# Patient Record
Sex: Male | Born: 1945 | Race: White | Hispanic: No | Smoking: Never smoker
Health system: Southern US, Community
[De-identification: ages and names within clinical notes are randomized; demographics above are authoritative.]

## PROBLEM LIST (undated history)

## (undated) DIAGNOSIS — M1990 Unspecified osteoarthritis, unspecified site: Secondary | ICD-10-CM

## (undated) DIAGNOSIS — Z8489 Family history of other specified conditions: Secondary | ICD-10-CM

## (undated) DIAGNOSIS — I10 Essential (primary) hypertension: Secondary | ICD-10-CM

## (undated) DIAGNOSIS — Z972 Presence of dental prosthetic device (complete) (partial): Secondary | ICD-10-CM

## (undated) DIAGNOSIS — I6381 Other cerebral infarction due to occlusion or stenosis of small artery: Secondary | ICD-10-CM

---

## 2019-10-07 ENCOUNTER — Other Ambulatory Visit: Payer: Self-pay

## 2019-10-08 ENCOUNTER — Encounter: Payer: Self-pay | Admitting: Gastroenterology

## 2019-10-08 ENCOUNTER — Ambulatory Visit (INDEPENDENT_AMBULATORY_CARE_PROVIDER_SITE_OTHER): Payer: Medicare Other | Admitting: Gastroenterology

## 2019-10-08 ENCOUNTER — Other Ambulatory Visit: Payer: Self-pay

## 2019-10-08 ENCOUNTER — Encounter (INDEPENDENT_AMBULATORY_CARE_PROVIDER_SITE_OTHER): Payer: Self-pay

## 2019-10-08 VITALS — BP 167/72 | HR 74 | Temp 97.9°F | Ht 68.0 in | Wt 198.2 lb

## 2019-10-08 DIAGNOSIS — R140 Abdominal distension (gaseous): Secondary | ICD-10-CM | POA: Diagnosis not present

## 2019-10-08 DIAGNOSIS — K219 Gastro-esophageal reflux disease without esophagitis: Secondary | ICD-10-CM | POA: Diagnosis not present

## 2019-10-08 DIAGNOSIS — R194 Change in bowel habit: Secondary | ICD-10-CM

## 2019-10-08 MED ORDER — NA SULFATE-K SULFATE-MG SULF 17.5-3.13-1.6 GM/177ML PO SOLN: 354.0000 mL | Freq: Once | ORAL | 0 refills | Status: AC

## 2019-10-08 NOTE — Progress Notes (Signed)
Arlyss Repress, MD [address]  [address]  [place], [place] [address]  Main: [phone]  Fax: [phone]    Gastroenterology Consultation  Referring Provider:     Marina Goodell, MD Primary Care Physician:  Marina Goodell, MD Primary Gastroenterologist:  Dr. Arlyss Repress Reason for Consultation:     Altered bowel habits, chronic gerd        HPI:   Gary Esparza is a 74 y.o. male referred by Dr. Marina Goodell, MD  for consultation & management of altered bowel habits.  Patient reports history of small frequent bowel movements, variable consistency, hard to lose, string-like, nonbloody.  Reports incomplete evacuation, abdominal bloating.  He goes about 3-4 times a day.  He reports that he does not consume fruits and vegetables daily.  He also reports mild lower abdominal discomfort.  He is taking Benefiber once a day.  He has not tried any stool softeners.  He denies any weight loss  Chronic GERD: Patient reports chronic burning in his throat and regurgitation of acid which is intermittent, triggered after eating certain foods.  Patient cannot eat late at night, reports nocturnal heartburn.  He takes acid suppressive medication as needed only.  He never had an upper endoscopy  Patient does not smoke or drink alcohol Patient retired from [employer], moved from [place] few years ago to live with his girlfriend He denies any abdominal surgeries NSAIDs: None  Antiplts/Anticoagulants/Anti thrombotics: None  GI Procedures: Colonoscopy several years ago, reportedly normal  History reviewed. No pertinent past medical history.  History reviewed. No pertinent surgical history.  Current Outpatient Medications:  .  cetirizine (ZYRTEC) 10 MG tablet, Take by mouth., Disp: , Rfl:  .  Cholecalciferol (VITAMIN D-3) 125 MCG (5000 UT) TABS, Take by mouth., Disp: , Rfl:  .  Corn Dextrin ([employer] EASY FIBER) POWD, Take by mouth., Disp: , Rfl:  .  losartan (COZAAR) 25 MG  tablet, Take by mouth., Disp: , Rfl:  .  Multiple Vitamins-Minerals (VITRUM SENIOR) TABS, Take by mouth., Disp: , Rfl:  .  pravastatin (PRAVACHOL) 20 MG tablet, Take by mouth., Disp: , Rfl:  .  Probiotic Product (PROBIOTIC-10 PO), Take by mouth., Disp: , Rfl:  .  Na Sulfate-K Sulfate-Mg Sulf 17.5-3.13-1.6 GM/177ML SOLN, Take 354 mLs by mouth once for 1 dose., Disp: 354 mL, Rfl: 0   History reviewed. No pertinent family history.   Social History   Tobacco Use  . Smoking status: Never Smoker  . Smokeless tobacco: Never Used  Vaping Use  . Vaping Use: Never used  Substance Use Topics  . Alcohol use: Yes    Comment: 2 a month   . Drug use: Never    Allergies as of 10/08/2019 - Review Complete 10/08/2019  Allergen Reaction Noted  . Other Rash 08/27/2019    Review of Systems:    All systems reviewed and negative except where noted in HPI.   Physical Exam:  BP (!) 167/72 (BP Location: Left Arm, Patient Position: Sitting, Cuff Size: Normal)   Pulse 74   Temp 97.9 F (36.6 C) (Oral)   Ht 5\' 8"  (1.727 m)   Wt 198 lb 4 oz (89.9 kg)   BMI 30.14 kg/m  No LMP for male patient.  General:   Alert,  Well-developed, well-nourished, pleasant and cooperative in NAD Head:  Normocephalic and atraumatic. Eyes:  Sclera clear, no icterus.   Conjunctiva pink. Ears:  Normal auditory acuity. Nose:  No deformity, discharge,  or lesions. Mouth:  No deformity or lesions,oropharynx pink & moist. Neck:  Supple; no masses or thyromegaly. Lungs:  Respirations even and unlabored.  Clear throughout to auscultation.   No wheezes, crackles, or rhonchi. No acute distress. Heart:  Regular rate and rhythm; no murmurs, clicks, rubs, or gallops. Abdomen:  Normal bowel sounds. Soft, non-tender and moderately distended, tympanic to percussion without masses, hepatosplenomegaly or hernias noted.  No guarding or rebound tenderness.   Rectal: Not performed Msk: Prominent and enlarged right clavicular bone,  compared to left. Good, equal movement & strength bilaterally. Pulses:  Normal pulses noted. Extremities:  No clubbing or edema.  No cyanosis. Neurologic:  Alert and oriented x3;  grossly normal neurologically. Skin:  Intact without significant lesions or rashes. No jaundice. Psych:  Alert and cooperative. Normal mood and affect.  Imaging Studies: No abdominal imaging  Assessment and Plan:   Gary Esparza is a 74 y.o. male with no significant past medical history is seen in consultation for altered bowel habits and chronic GERD  Altered bowel habits with incomplete evacuation and abdominal bloating Most likely secondary to constipation Discussed about high-fiber diet, fiber supplements Adequate intake of water Recommend colonoscopy given his age  Chronic GERD Continue antireflux lifestyle, information provided Recommend EGD to screen for Barrett's  Prominent and enlarged right clavicular bone Follow-up with PCP   Follow up in 2 to 3 months   Cephas Darby, MD

## 2019-10-08 NOTE — Patient Instructions (Addendum)
High-Fiber Diet Fiber, also called dietary fiber, is a type of carbohydrate that is found in fruits, vegetables, whole grains, and beans. A high-fiber diet can have many health benefits. Your health care provider may recommend a high-fiber diet to help:  Prevent constipation. Fiber can make your bowel movements more regular.  Lower your cholesterol.  Relieve the following conditions: ? Swelling of veins in the anus (hemorrhoids). ? Swelling and irritation (inflammation) of specific areas of the digestive tract (uncomplicated diverticulosis). ? A problem of the large intestine (colon) that sometimes causes pain and diarrhea (irritable bowel syndrome, IBS).  Prevent overeating as part of a weight-loss plan.  Prevent heart disease, type 2 diabetes, and certain cancers. What is my plan? The recommended daily fiber intake in grams (g) includes:  38 g for men age 50 or younger.  30 g for men over age 50.  25 g for women age 50 or younger.  21 g for women over age 50. You can get the recommended daily intake of dietary fiber by:  Eating a variety of fruits, vegetables, grains, and beans.  Taking a fiber supplement, if it is not possible to get enough fiber through your diet. What do I need to know about a high-fiber diet?  It is better to get fiber through food sources rather than from fiber supplements. There is not a lot of research about how effective supplements are.  Always check the fiber content on the nutrition facts label of any prepackaged food. Look for foods that contain 5 g of fiber or more per serving.  Talk with a diet and nutrition specialist (dietitian) if you have questions about specific foods that are recommended or not recommended for your medical condition, especially if those foods are not listed below.  Gradually increase how much fiber you consume. If you increase your intake of dietary fiber too quickly, you may have bloating, cramping, or gas.  Drink plenty  of water. Water helps you to digest fiber. What are tips for following this plan?  Eat a wide variety of high-fiber foods.  Make sure that half of the grains that you eat each day are whole grains.  Eat breads and cereals that are made with whole-grain flour instead of refined flour or white flour.  Eat brown rice, bulgur wheat, or millet instead of white rice.  Start the day with a breakfast that is high in fiber, such as a cereal that contains 5 g of fiber or more per serving.  Use beans in place of meat in soups, salads, and pasta dishes.  Eat high-fiber snacks, such as berries, raw vegetables, nuts, and popcorn.  Choose whole fruits and vegetables instead of processed forms like juice or sauce. What foods can I eat?  Fruits Berries. Pears. Apples. Oranges. Avocado. Prunes and raisins. Dried figs. Vegetables Sweet potatoes. Spinach. Kale. Artichokes. Cabbage. Broccoli. Cauliflower. Green peas. Carrots. Squash. Grains Whole-grain breads. Multigrain cereal. Oats and oatmeal. Brown rice. Barley. Bulgur wheat. Millet. Quinoa. Bran muffins. Popcorn. Rye wafer crackers. Meats and other proteins Navy, kidney, and pinto beans. Soybeans. Split peas. Lentils. Nuts and seeds. Dairy Fiber-fortified yogurt. Beverages Fiber-fortified soy milk. Fiber-fortified orange juice. Other foods Fiber bars. The items listed above may not be a complete list of recommended foods and beverages. Contact a dietitian for more options. What foods are not recommended? Fruits Fruit juice. Cooked, strained fruit. Vegetables Fried potatoes. Canned vegetables. Well-cooked vegetables. Grains White bread. Pasta made with refined flour. White rice. Meats and other   proteins Fatty cuts of meat. Fried chicken or fried fish. Dairy Milk. Yogurt. Cream cheese. Sour cream. Fats and oils Butters. Beverages Soft drinks. Other foods Cakes and pastries. The items listed above may not be a complete list of foods  and beverages to avoid. Contact a dietitian for more information. Summary  Fiber is a type of carbohydrate. It is found in fruits, vegetables, whole grains, and beans.  There are many health benefits of eating a high-fiber diet, such as preventing constipation, lowering blood cholesterol, helping with weight loss, and reducing your risk of heart disease, diabetes, and certain cancers.  Gradually increase your intake of fiber. Increasing too fast can result in cramping, bloating, and gas. Drink plenty of water while you increase your fiber.  The best sources of fiber include whole fruits and vegetables, whole grains, nuts, seeds, and beans. This information is not intended to replace advice given to you by your health care provider. Make sure you discuss any questions you have with your health care provider. Document Revised: 02/12/2017 Document Reviewed: 02/12/2017 Elsevier Patient Education  2020 Elsevier Inc. Food Choices for Gastroesophageal Reflux Disease, Adult When you have gastroesophageal reflux disease (GERD), the foods you eat and your eating habits are very important. Choosing the right foods can help ease your discomfort. Think about working with a nutrition specialist (dietitian) to help you make good choices. What are tips for following this plan?  Meals  Choose healthy foods that are low in fat, such as fruits, vegetables, whole grains, low-fat dairy products, and lean meat, fish, and poultry.  Eat small meals often instead of 3 large meals a day. Eat your meals slowly, and in a place where you are relaxed. Avoid bending over or lying down until 2-3 hours after eating.  Avoid eating meals 2-3 hours before bed.  Avoid drinking a lot of liquid with meals.  Cook foods using methods other than frying. Bake, grill, or broil food instead.  Avoid or limit: ? Chocolate. ? Peppermint or spearmint. ? Alcohol. ? Pepper. ? Black and decaffeinated coffee. ? Black and  decaffeinated tea. ? Bubbly (carbonated) soft drinks. ? Caffeinated energy drinks and soft drinks.  Limit high-fat foods such as: ? Fatty meat or fried foods. ? Whole milk, cream, butter, or ice cream. ? Nuts and nut butters. ? Pastries, donuts, and sweets made with butter or shortening.  Avoid foods that cause symptoms. These foods may be different for everyone. Common foods that cause symptoms include: ? Tomatoes. ? Oranges, lemons, and limes. ? Peppers. ? Spicy food. ? Onions and garlic. ? Vinegar. Lifestyle  Maintain a healthy weight. Ask your doctor what weight is healthy for you. If you need to lose weight, work with your doctor to do so safely.  Exercise for at least 30 minutes for 5 or more days each week, or as told by your doctor.  Wear loose-fitting clothes.  Do not smoke. If you need help quitting, ask your doctor.  Sleep with the head of your bed higher than your feet. Use a wedge under the mattress or blocks under the bed frame to raise the head of the bed. Summary  When you have gastroesophageal reflux disease (GERD), food and lifestyle choices are very important in easing your symptoms.  Eat small meals often instead of 3 large meals a day. Eat your meals slowly, and in a place where you are relaxed.  Limit high-fat foods such as fatty meat or fried foods.  Avoid bending over or   lying down until 2-3 hours after eating.  Avoid peppermint and spearmint, caffeine, alcohol, and chocolate. This information is not intended to replace advice given to you by your health care provider. Make sure you discuss any questions you have with your health care provider. Document Revised: 08/01/2018 Document Reviewed: 05/16/2016 Elsevier Patient Education  2020 Elsevier Inc.  

## 2019-11-24 ENCOUNTER — Other Ambulatory Visit: Payer: Self-pay | Admitting: Family Medicine

## 2019-11-24 ENCOUNTER — Other Ambulatory Visit: Payer: Self-pay | Admitting: *Deleted

## 2019-11-24 DIAGNOSIS — R310 Gross hematuria: Secondary | ICD-10-CM

## 2019-11-25 ENCOUNTER — Other Ambulatory Visit
Admission: RE | Admit: 2019-11-25 | Discharge: 2019-11-25 | Disposition: A | Discharge status: Home / Self Care | Payer: Medicare Other | Attending: Urology | Admitting: Urology

## 2019-11-25 ENCOUNTER — Encounter: Payer: Self-pay | Admitting: Urology

## 2019-11-25 ENCOUNTER — Other Ambulatory Visit: Payer: Self-pay

## 2019-11-25 ENCOUNTER — Ambulatory Visit (INDEPENDENT_AMBULATORY_CARE_PROVIDER_SITE_OTHER): Payer: Medicare Other | Admitting: Urology

## 2019-11-25 VITALS — BP 146/85 | HR 76 | Ht 67.0 in | Wt 198.0 lb

## 2019-11-25 DIAGNOSIS — R310 Gross hematuria: Secondary | ICD-10-CM | POA: Diagnosis not present

## 2019-11-25 LAB — URINALYSIS, COMPLETE (UACMP) WITH MICROSCOPIC
Bacteria, UA: NONE SEEN
Bilirubin Urine: NEGATIVE
Glucose, UA: NEGATIVE mg/dL
Ketones, ur: NEGATIVE mg/dL
Leukocytes,Ua: NEGATIVE
Nitrite: NEGATIVE
Protein, ur: NEGATIVE mg/dL
Specific Gravity, Urine: 1.01 (ref 1.005–1.030)
WBC, UA: NONE SEEN WBC/hpf (ref 0–5)
pH: 6.5 (ref 5.0–8.0)

## 2019-11-25 NOTE — Progress Notes (Signed)
   11/25/19 10:27 AM   Gary Esparza 11/05/1945 [id number]  CC: Gross hematuria  HPI: I saw Gary Esparza in urology clinic today for evaluation of gross hematuria.  He reports 2 days of gross hematuria with small clots 3 to 4 days ago that has resolved spontaneously.  He denies any trauma, difficulty urinating, or any significant pain.  Urine culture showed no growth with his PCP, and greater than 50 RBCs.  Urinalysis today shows persistent microscopic hematuria with 6-10 RBCs.  Has had some mild left upper back pain that has been intermittent.  He denies any history of kidney stones.  He has a history of a TURP in 2011.  He also reportedly had gross hematuria 2 to 3 years ago that was worked up by a urologist in [place] and reportedly showed no abnormalities.  He has mild urinary symptoms of weak stream, and occasional frequency, but is not particularly bothered by his symptoms at this time.  He denies any family history of any urologic malignancies.  He denies any smoking history or other carcinogenic exposures.  Social History:  reports that he has never smoked. He has never used smokeless tobacco. He reports current alcohol use. He reports that he does not use drugs.  Physical Exam: BP (!) 146/85   Pulse 76   Ht 5\' 7"  (1.702 m)   Wt 198 lb (89.8 kg)   BMI 31.01 kg/m    Constitutional:  Alert and oriented, No acute distress. Cardiovascular: No clubbing, cyanosis, or edema. Respiratory: Normal respiratory effort, no increased work of breathing. GI: Abdomen is soft, nontender, nondistended, no abdominal masses GU: No CVA tenderness  Laboratory Data: Reviewed, see HPI Urinalysis today with 6-10 RBCs, otherwise benign  Pertinent Imaging: None to review  Assessment & Plan:   Healthy 74 year old male with asymptomatic gross hematuria that has since resolved, but persistent microscopic hematuria today on urinalysis.  We discussed common possible etiologies of hematuria including  BPH, malignancy, urolithiasis, medical renal disease, and idiopathic. Standard workup recommended by the [facility] includes imaging with CT urogram to assess the upper tracts, and cystoscopy. Cytology is performed on patient's with gross hematuria to look for malignant cells in the urine.  -CT urogram this Friday, follow-up next week for cystoscopy -Consider outlet procedure in the future pending urinary symptoms, prostate volume and cystoscopy results, and recurrence of gross hematuria   Sunday, MD 11/25/2019  [facility] [address], [address] [place], [place] [phone]

## 2019-11-25 NOTE — Patient Instructions (Signed)

## 2019-11-27 ENCOUNTER — Other Ambulatory Visit
Admission: RE | Admit: 2019-11-27 | Discharge: 2019-11-27 | Disposition: A | Discharge status: Home / Self Care | Payer: Medicare Other | Source: Ambulatory Visit | Attending: Gastroenterology | Admitting: Gastroenterology

## 2019-11-27 ENCOUNTER — Other Ambulatory Visit: Payer: Self-pay

## 2019-11-27 ENCOUNTER — Other Ambulatory Visit: Payer: Self-pay | Admitting: Family Medicine

## 2019-11-27 ENCOUNTER — Ambulatory Visit
Admission: RE | Admit: 2019-11-27 | Discharge: 2019-11-27 | Disposition: A | Discharge status: Home / Self Care | Payer: Medicare Other | Source: Ambulatory Visit | Attending: Family Medicine | Admitting: Family Medicine

## 2019-11-27 DIAGNOSIS — R310 Gross hematuria: Secondary | ICD-10-CM | POA: Insufficient documentation

## 2019-11-27 DIAGNOSIS — Z01812 Encounter for preprocedural laboratory examination: Secondary | ICD-10-CM | POA: Insufficient documentation

## 2019-11-27 DIAGNOSIS — Z20822 Contact with and (suspected) exposure to covid-19: Secondary | ICD-10-CM | POA: Insufficient documentation

## 2019-11-28 ENCOUNTER — Other Ambulatory Visit
Admission: RE | Admit: 2019-11-28 | Discharge: 2019-11-28 | Disposition: A | Discharge status: Home / Self Care | Payer: Medicare Other | Source: Ambulatory Visit | Attending: Urology | Admitting: Urology

## 2019-11-28 ENCOUNTER — Ambulatory Visit: Payer: Medicare Other

## 2019-11-28 ENCOUNTER — Ambulatory Visit
Admission: RE | Admit: 2019-11-28 | Discharge: 2019-11-28 | Disposition: A | Discharge status: Home / Self Care | Payer: Medicare Other | Source: Ambulatory Visit | Attending: Urology | Admitting: Urology

## 2019-11-28 DIAGNOSIS — R310 Gross hematuria: Secondary | ICD-10-CM

## 2019-11-28 LAB — CREATININE, SERUM
Creatinine, Ser: 0.99 mg/dL (ref 0.61–1.24)
GFR calc Af Amer: >60 mL/min (ref >60)
GFR calc non Af Amer: >60 mL/min (ref >60)

## 2019-11-28 LAB — SARS CORONAVIRUS 2 (TAT 6-24 HRS): SARS Coronavirus 2: NEGATIVE

## 2019-11-28 IMAGING — CT CT ABD-PEL WO/W CM
2 of 6 series · 13 of 32 positions shown, 18 images · IV contrast (APPLIED)
Comparison: None.

CLINICAL DATA: Episode of gross hematuria 2 weeks ago. Persistent
microscopic hematuria.

EXAM:
CT ABDOMEN AND PELVIS WITHOUT AND WITH CONTRAST
TECHNIQUE: Multidetector CT imaging of the abdomen and pelvis was performed
following the standard protocol before and following the bolus
administration of intravenous contrast.
CONTRAST:  125mL OMNIPAQUE IOHEXOL 300 MG/ML  SOLN

[Series 2: axial pre · axial · non-contrast · 0.79mm/px · z∈[-960,-615]mm · 6 of 97 slices shown]
[im 14/97  soft-tissue]
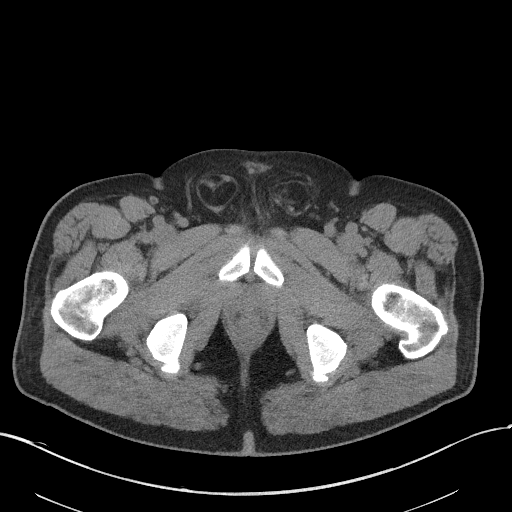
[im 28/97  soft-tissue]
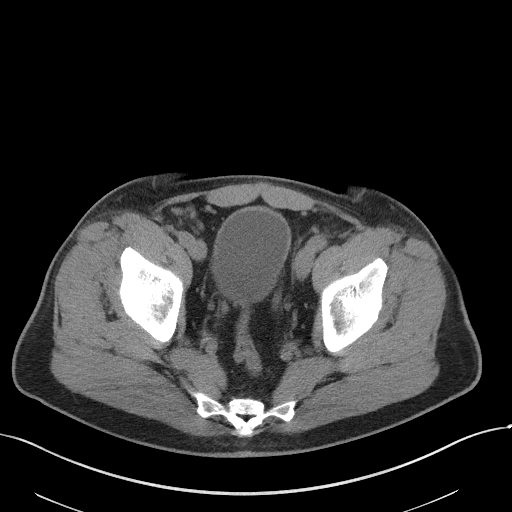
[im 42/97  soft-tissue]
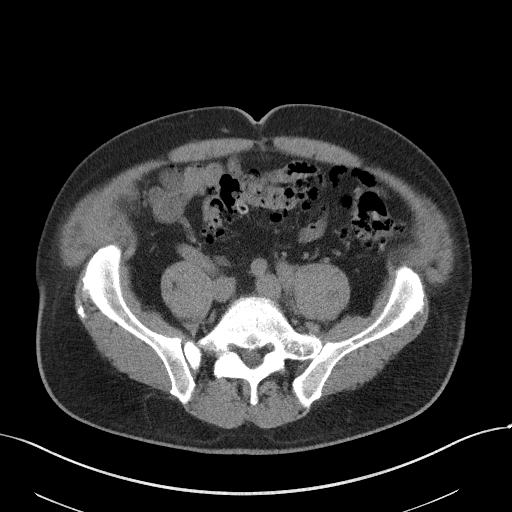
[im 55/97  soft-tissue]
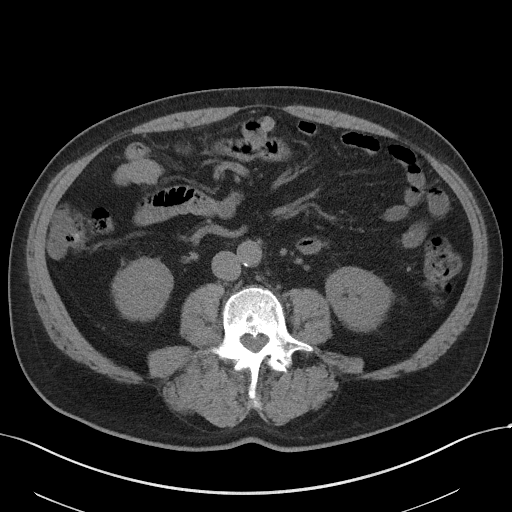
[im 69/97  soft-tissue]
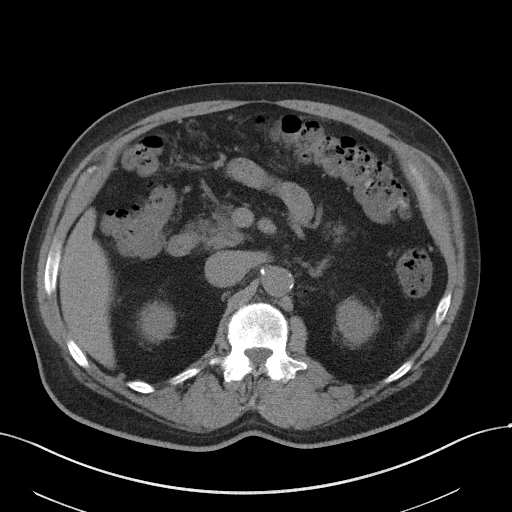
[im 83/97  soft-tissue]
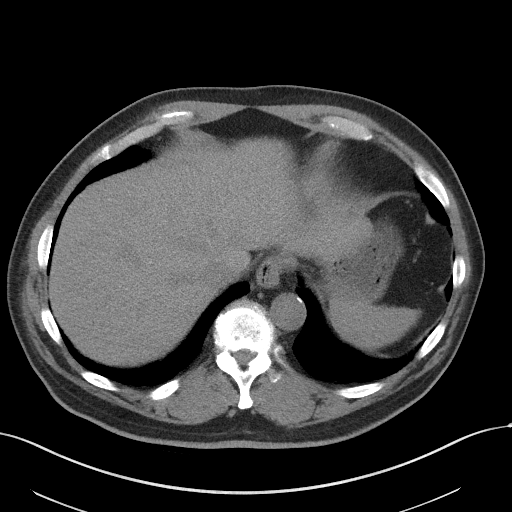

[Series 13: axial delay · axial · delayed · 0.78mm/px · z∈[-994,-604]mm · 7 of 105 slices shown, 12 images]
[im 14/105  soft-tissue]
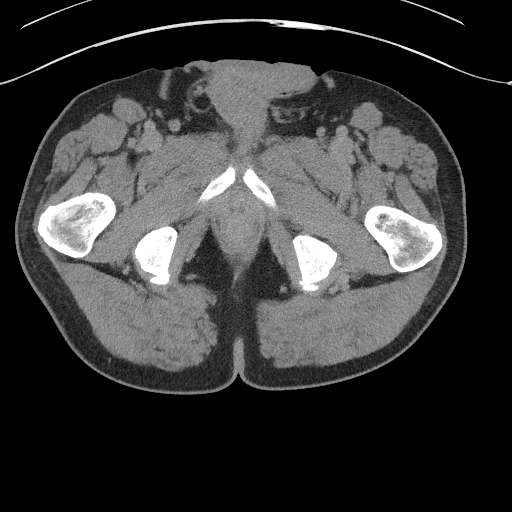
[im 14/105  bone]
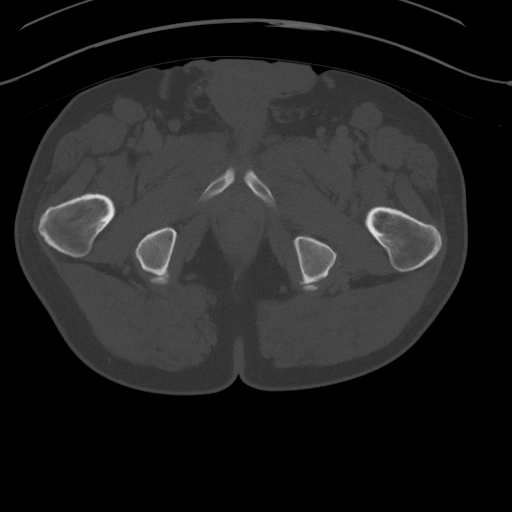
[im 27/105  soft-tissue]
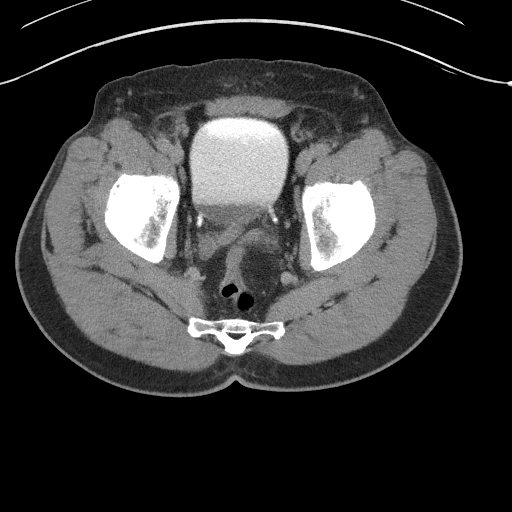
[im 40/105  soft-tissue]
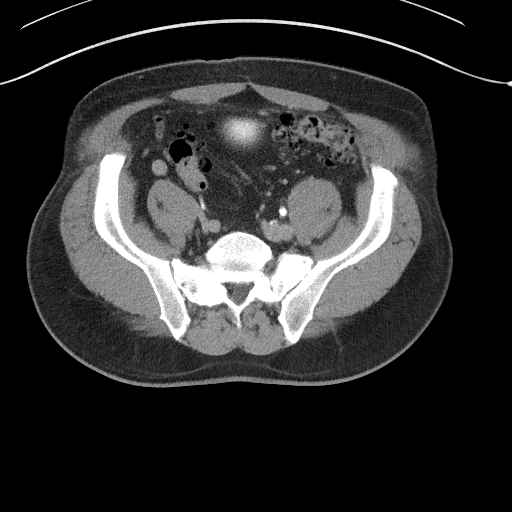
[im 53/105  soft-tissue]
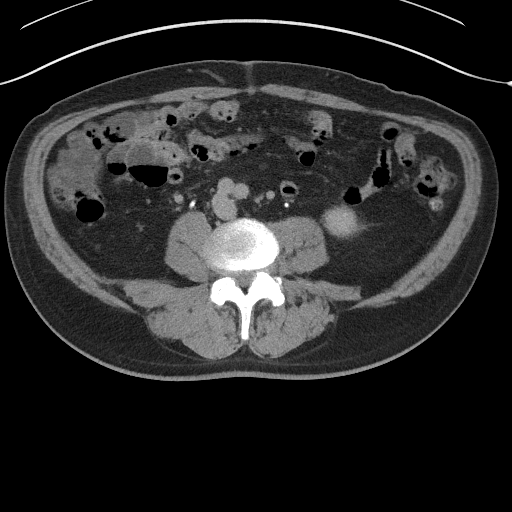
[im 53/105  lung]
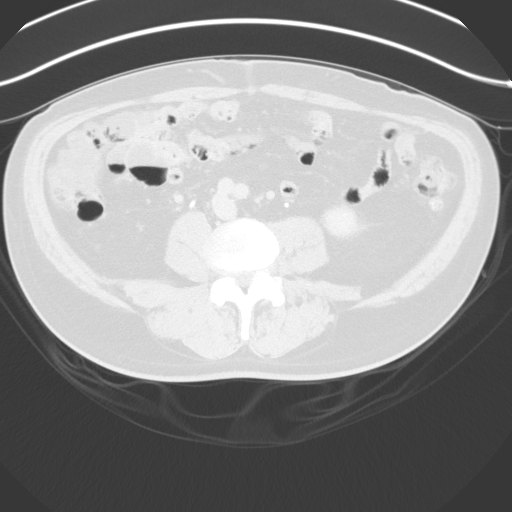
[im 66/105  soft-tissue]
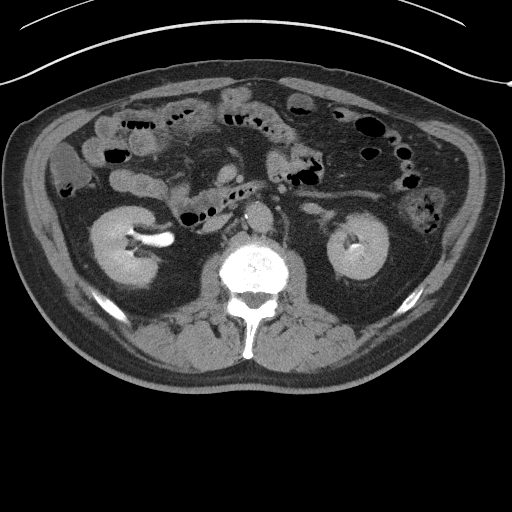
[im 66/105  lung]
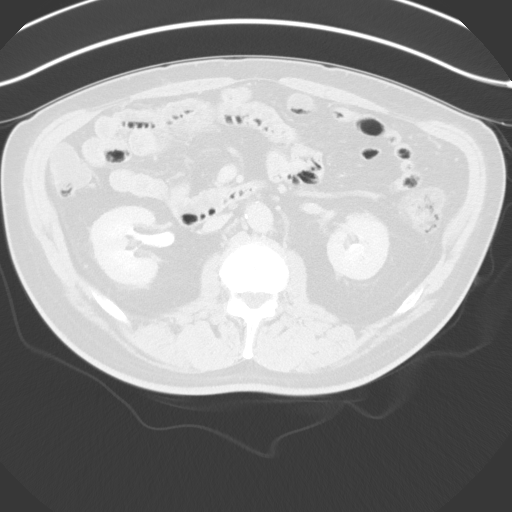
[im 79/105  soft-tissue]
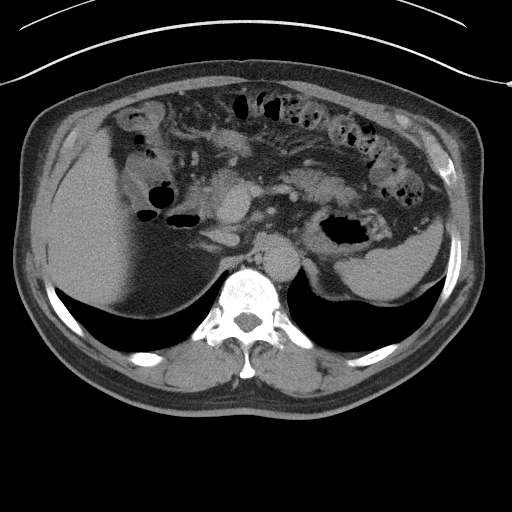
[im 79/105  lung]
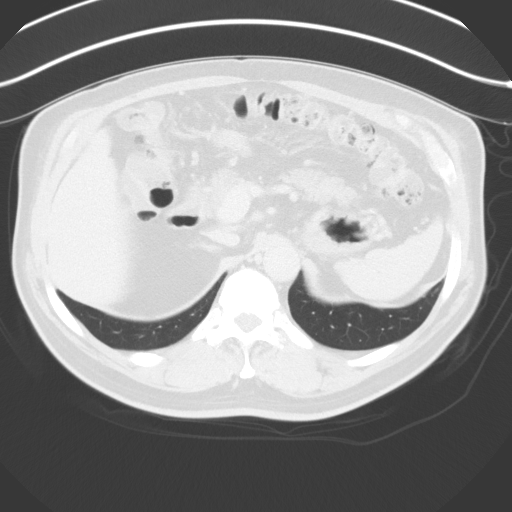
[im 92/105  soft-tissue]
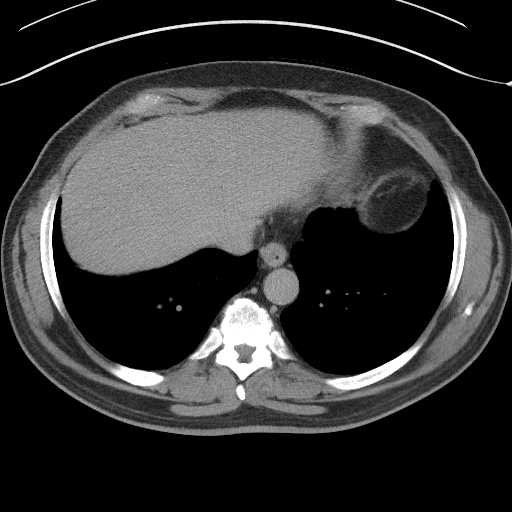
[im 92/105  lung]
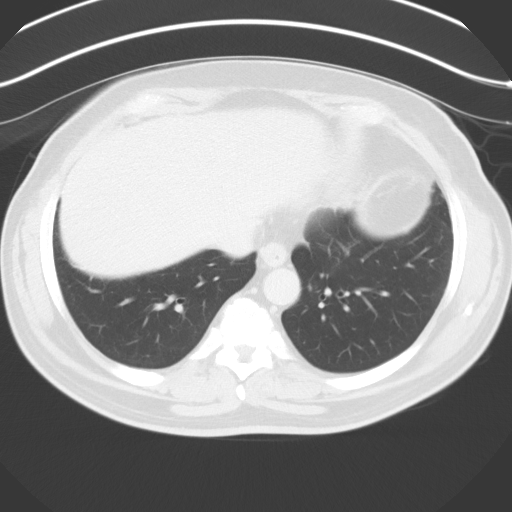

[13 of 32 positions shown; findings below may reference images not displayed]

FINDINGS: Lower chest: The lung bases are clear of acute process. No pleural
effusion or pulmonary lesions. The heart is normal in size. No
pericardial effusion. The distal esophagus and aorta are
unremarkable.

Hepatobiliary: No hepatic lesions or intrahepatic biliary
dilatation. The gallbladder is normal. No common bile duct
dilatation.

Pancreas: No mass, inflammation or ductal dilatation. Duodenal
diverticulum noted near the pancreatic head.

Spleen: Normal size.  No focal lesions.

Adrenals/Urinary Tract: The adrenal glands are normal.

No renal, ureteral or bladder calculi.

Both kidneys demonstrate normal enhancement/perfusion following
contrast administration. No worrisome renal lesions. The delayed
images do not demonstrate any significant collecting system
abnormalities. Both ureters are normal. The bladder is unremarkable.
No bladder mass or asymmetric bladder wall thickening. Enlarged
prostate gland with median lobe hypertrophy impressing on the base
the bladder is noted. There are also surgical changes from a TURP.

Stomach/Bowel: The stomach, duodenum, small bowel and colon are
grossly normal without oral contrast. No acute inflammatory changes,
mass lesions or obstructive findings. The terminal ileum and
appendix are normal. There is severe sigmoid colon diverticulosis
but no findings for acute diverticulitis.

Vascular/Lymphatic: Scattered aortic calcifications but no aneurysm
or dissection. The branch vessels are patent. The major venous
structures are patent. No mesenteric or retroperitoneal mass or
adenopathy.

Reproductive: Enlarged prostate gland with evidence of prior TURP
the seminal vesicles are unremarkable.

Other: Bilateral inguinal hernias containing fat. No pelvic mass or
adenopathy.

Musculoskeletal: No significant bony findings. Moderate degenerative
changes involving the spine.
IMPRESSION: 1. No CT findings to account for the patient's hematuria. No renal,
ureteral or bladder calculi or mass.
2. Enlarged prostate gland with median lobe hypertrophy impressing
on the base the bladder. There are also surgical changes from a
TURP.
3. Severe sigmoid colon diverticulosis without findings for acute
diverticulitis.

Aortic Atherosclerosis ([XP]-[XP]).

## 2019-11-28 MED ORDER — IOHEXOL 300 MG/ML  SOLN
125.0000 mL | Freq: Once | INTRAMUSCULAR | Status: AC | PRN
  Administered 2019-11-28: 125 mL via INTRAVENOUS

## 2019-12-01 ENCOUNTER — Encounter: Payer: Self-pay | Admitting: Gastroenterology

## 2019-12-01 ENCOUNTER — Ambulatory Visit: Payer: Medicare Other | Admitting: Registered Nurse

## 2019-12-01 ENCOUNTER — Ambulatory Visit
Admission: RE | Admit: 2019-12-01 | Discharge: 2019-12-01 | Disposition: A | Discharge status: Home / Self Care | Payer: Medicare Other | Attending: Gastroenterology | Admitting: Gastroenterology

## 2019-12-01 ENCOUNTER — Encounter
Admission: RE | Disposition: A | Discharge status: Home / Self Care | Payer: Self-pay | Source: Home / Self Care | Attending: Gastroenterology

## 2019-12-01 ENCOUNTER — Other Ambulatory Visit: Payer: Self-pay

## 2019-12-01 DIAGNOSIS — R194 Change in bowel habit: Secondary | ICD-10-CM | POA: Diagnosis present

## 2019-12-01 DIAGNOSIS — Z13810 Encounter for screening for upper gastrointestinal disorder: Secondary | ICD-10-CM | POA: Insufficient documentation

## 2019-12-01 DIAGNOSIS — K219 Gastro-esophageal reflux disease without esophagitis: Secondary | ICD-10-CM | POA: Diagnosis not present

## 2019-12-01 DIAGNOSIS — R195 Other fecal abnormalities: Secondary | ICD-10-CM | POA: Diagnosis not present

## 2019-12-01 DIAGNOSIS — K5730 Diverticulosis of large intestine without perforation or abscess without bleeding: Secondary | ICD-10-CM | POA: Diagnosis not present

## 2019-12-01 HISTORY — PX: ESOPHAGOGASTRODUODENOSCOPY (EGD) WITH PROPOFOL: SHX5813

## 2019-12-01 HISTORY — PX: COLONOSCOPY WITH PROPOFOL: SHX5780

## 2019-12-01 SURGERY — COLONOSCOPY WITH PROPOFOL
Anesthesia: General

## 2019-12-01 MED ORDER — PROPOFOL 10 MG/ML IV BOLUS
INTRAVENOUS | Status: DC | PRN
  Administered 2019-12-01: 80 mg via INTRAVENOUS

## 2019-12-01 MED ORDER — SODIUM CHLORIDE 0.9 % IV SOLN
INTRAVENOUS | Status: DC
  Administered 2019-12-01: 20 mL/h via INTRAVENOUS

## 2019-12-01 MED ORDER — LIDOCAINE HCL (CARDIAC) PF 100 MG/5ML IV SOSY
PREFILLED_SYRINGE | INTRAVENOUS | Status: DC | PRN
  Administered 2019-12-01: 100 mg via INTRAVENOUS

## 2019-12-01 MED ORDER — PROPOFOL 500 MG/50ML IV EMUL
INTRAVENOUS | Status: DC | PRN
  Administered 2019-12-01: 150 ug/kg/min via INTRAVENOUS

## 2019-12-01 NOTE — H&P (Signed)
Arlyss Repress, MD [address]  [address]  [place], [place] [address]  Main: [phone]  Fax: [phone] Pager: [phone]  Primary Care Physician:  Marina Goodell, MD Primary Gastroenterologist:  Dr. Arlyss Repress  Pre-Procedure History & Physical: HPI:  Gary Esparza is a 74 y.o. male is here for an endoscopy and colonoscopy.   History reviewed. No pertinent past medical history.  History reviewed. No pertinent surgical history.  Prior to Admission medications   Medication Sig Start Date End Date Taking? Authorizing Provider  Cholecalciferol (VITAMIN D-3) 125 MCG (5000 UT) TABS Take by mouth.    [provider]  Corn Dextrin ([employer] EASY FIBER) POWD Take by mouth.    [provider]  losartan (COZAAR) 25 MG tablet Take by mouth.    [provider]  Multiple Vitamins-Minerals (VITRUM SENIOR) TABS Take by mouth.    [provider]  pravastatin (PRAVACHOL) 20 MG tablet Take by mouth.    [provider]  Probiotic Product (PROBIOTIC-10 PO) Take by mouth.    [provider]    Allergies as of 10/08/2019 - Review Complete 10/08/2019  Allergen Reaction Noted  . Other Rash 08/27/2019    History reviewed. No pertinent family history.  Social History   Socioeconomic History  . Marital status: Widowed    Spouse name: Not on file  . Number of children: Not on file  . Years of education: Not on file  . Highest education level: Not on file  Occupational History  . Not on file  Tobacco Use  . Smoking status: Never Smoker  . Smokeless tobacco: Never Used  Vaping Use  . Vaping Use: Never used  Substance and Sexual Activity  . Alcohol use: Yes    Comment: 2 a month   . Drug use: Never  . Sexual activity: Not on file  Other Topics Concern  . Not on file  Social History Narrative  . Not on file   Social Determinants of Health   Financial Resource Strain:   . Difficulty of Paying Living Expenses:     Food Insecurity:   . Worried About Programme researcher, broadcasting/film/video in the Last Year:   . Barista in the Last Year:   Transportation Needs:   . Freight forwarder (Medical):   Marland Kitchen Lack of Transportation (Non-Medical):   Physical Activity:   . Days of Exercise per Week:   . Minutes of Exercise per Session:   Stress:   . Feeling of Stress :   Social Connections:   . Frequency of Communication with Friends and Family:   . Frequency of Social Gatherings with Friends and Family:   . Attends Religious Services:   . Active Member of Clubs or Organizations:   . Attends Banker Meetings:   Marland Kitchen Marital Status:   Intimate Partner Violence:   . Fear of Current or Ex-Partner:   . Emotionally Abused:   Marland Kitchen Physically Abused:   . Sexually Abused:     Review of Systems: See HPI, otherwise negative ROS  Physical Exam: BP (!) 147/94   Pulse 72   Temp (!) 96.2 F (35.7 C) (Temporal)   Resp 18   Ht 5\' 7"  (1.702 m)   Wt 90.7 kg   SpO2 100%   BMI 31.32 kg/m  General:   Alert,  pleasant and cooperative in NAD Head:  Normocephalic and atraumatic. Neck:  Supple; no masses or thyromegaly. Lungs:  Clear throughout to  auscultation.    Heart:  Regular rate and rhythm. Abdomen:  Soft, nontender and nondistended. Normal bowel sounds, without guarding, and without rebound.   Neurologic:  Alert and  oriented x4;  grossly normal neurologically.  Impression/Plan: Gary Esparza is here for an endoscopy and colonoscopy to be performed for altered bowel habits, chronic gerd  Risks, benefits, limitations, and alternatives regarding  endoscopy and colonoscopy have been reviewed with the patient.  Questions have been answered.  All parties agreeable.   Lannette Donath, MD  12/01/2019, 9:53 AM

## 2019-12-01 NOTE — Op Note (Signed)
[facility] Gastroenterology Patient Name: Gary Esparza Procedure Date: 12/01/2019 10:16 AM MRN: [id number] Account #: [id number] Date of Birth: [birth year] Admit Type: Outpatient Age: 74 Room: [facility] ENDO ROOM 4 Gender: Male Note Status: Finalized Procedure:             Colonoscopy Indications:           Change in bowel habits, Change in stool caliber Providers:             Lin Landsman MD, MD Medicines:             Monitored Anesthesia Care Complications:         No immediate complications. Estimated blood loss: None. Procedure:             Pre-Anesthesia Assessment:                        - Prior to the procedure, a History and Physical was                         performed, and patient medications and allergies were                         reviewed. The patient is competent. The risks and                         benefits of the procedure and the sedation options and                         risks were discussed with the patient. All questions                         were answered and informed consent was obtained.                         Patient identification and proposed procedure were                         verified by the physician, the nurse, the                         anesthesiologist, the anesthetist and the technician                         in the pre-procedure area in the procedure room in the                         endoscopy suite. Mental Status Examination: alert and                         oriented. Airway Examination: normal oropharyngeal                         airway and neck mobility. Respiratory Examination:                         clear to auscultation. CV Examination: normal.                         Prophylactic Antibiotics: The  patient does not require                         prophylactic antibiotics. Prior Anticoagulants: The                         patient has taken no previous anticoagulant or                         antiplatelet  agents. ASA Grade Assessment: II - A                         patient with mild systemic disease. After reviewing                         the risks and benefits, the patient was deemed in                         satisfactory condition to undergo the procedure. The                         anesthesia plan was to use monitored anesthesia care                         (MAC). Immediately prior to administration of                         medications, the patient was re-assessed for adequacy                         to receive sedatives. The heart rate, respiratory                         rate, oxygen saturations, blood pressure, adequacy of                         pulmonary ventilation, and response to care were                         monitored throughout the procedure. The physical                         status of the patient was re-assessed after the                         procedure.                        After obtaining informed consent, the colonoscope was                         passed under direct vision. Throughout the procedure,                         the patient's blood pressure, pulse, and oxygen                         saturations were monitored continuously. The  Colonoscope was introduced through the anus and                         advanced to the the cecum, identified by appendiceal                         orifice and ileocecal valve. The colonoscopy was                         performed without difficulty. The patient tolerated                         the procedure well. The quality of the bowel                         preparation was fair. Findings:      The perianal and digital rectal examinations were normal. Pertinent       negatives include normal sphincter tone and no palpable rectal lesions.      Multiple diverticula were found in the entire colon.      The exam was otherwise without abnormality.      The retroflexed view of the distal rectum and  anal verge was normal and       showed no anal or rectal abnormalities. Impression:            - Preparation of the colon was fair.                        - Diverticulosis in the entire examined colon.                        - The examination was otherwise normal.                        - The distal rectum and anal verge are normal on                         retroflexion view.                        - No specimens collected. Recommendation:        - Discharge patient to home (with escort).                        - Continue present medications.                        - High fiber diet. Procedure Code(s):     --- Professional ---                        [id number], Colonoscopy, flexible; diagnostic, including                         collection of specimen(s) by brushing or washing, when                         performed (separate procedure) Diagnosis Code(s):     --- Professional ---  R19.4, Change in bowel habit                        R19.5, Other fecal abnormalities                        K57.30, Diverticulosis of large intestine without                         perforation or abscess without bleeding CPT copyright 2019 American Medical Association. All rights reserved. The codes documented in this report are preliminary and upon coder review may  be revised to meet current compliance requirements. Dr. Ulyess Mort Lin Landsman MD, MD 12/01/2019 10:54:10 AM This report has been signed electronically. Number of Addenda: 0 Note Initiated On: 12/01/2019 10:16 AM Scope Withdrawal Time: 0 hours 6 minutes 50 seconds  Total Procedure Duration: 0 hours 10 minutes 6 seconds  Estimated Blood Loss:  Estimated blood loss: none.      [facility]

## 2019-12-01 NOTE — Op Note (Signed)
[facility] Gastroenterology Patient Name: Gary Esparza Procedure Date: 12/01/2019 10:16 AM MRN: [id number] Account #: [id number] Date of Birth: 02-19-1946 Admit Type: Outpatient Age: 74 Room: [facility] ENDO ROOM 4 Gender: Male Note Status: Finalized Procedure:             Upper GI endoscopy Indications:           Screening for Barrett's esophagus, Follow-up of                         gastro-esophageal reflux disease Providers:             Lin Landsman MD, MD Medicines:             Monitored Anesthesia Care Complications:         No immediate complications. Estimated blood loss: None. Procedure:             Pre-Anesthesia Assessment:                        - Prior to the procedure, a History and Physical was                         performed, and patient medications and allergies were                         reviewed. The patient is competent. The risks and                         benefits of the procedure and the sedation options and                         risks were discussed with the patient. All questions                         were answered and informed consent was obtained.                         Patient identification and proposed procedure were                         verified by the physician, the nurse, the                         anesthesiologist, the anesthetist and the technician                         in the pre-procedure area in the procedure room in the                         endoscopy suite. Mental Status Examination: alert and                         oriented. Airway Examination: normal oropharyngeal                         airway and neck mobility. Respiratory Examination:                         clear to auscultation. CV Examination: normal.  Prophylactic Antibiotics: The patient does not require                         prophylactic antibiotics. Prior Anticoagulants: The                         patient has taken no  previous anticoagulant or                         antiplatelet agents. ASA Grade Assessment: II - A                         patient with mild systemic disease. After reviewing                         the risks and benefits, the patient was deemed in                         satisfactory condition to undergo the procedure. The                         anesthesia plan was to use monitored anesthesia care                         (MAC). Immediately prior to administration of                         medications, the patient was re-assessed for adequacy                         to receive sedatives. The heart rate, respiratory                         rate, oxygen saturations, blood pressure, adequacy of                         pulmonary ventilation, and response to care were                         monitored throughout the procedure. The physical                         status of the patient was re-assessed after the                         procedure.                        After obtaining informed consent, the endoscope was                         passed under direct vision. Throughout the procedure,                         the patient's blood pressure, pulse, and oxygen                         saturations were monitored continuously. The Endoscope  was introduced through the mouth, and advanced to the                         second part of duodenum. The upper GI endoscopy was                         accomplished without difficulty. The patient tolerated                         the procedure well. Findings:      The esophagus, gastroesophageal junction and examined esophagus were       normal.      The stomach was normal.      The examined duodenum was normal.      Esophagogastric landmarks were identified: the gastroesophageal junction       was found at 40 cm from the incisors. Impression:            - Normal esophagus and gastroesophageal junction.                         - Normal stomach.                        - Normal examined duodenum.                        - Esophagogastric landmarks identified.                        - No specimens collected. Recommendation:        - Continue present medications.                        - Proceed with colonoscopy as scheduled                        See colonoscopy report Procedure Code(s):     --- Professional ---                        [phone], Esophagogastroduodenoscopy, flexible,                         transoral; diagnostic, including collection of                         specimen(s) by brushing or washing, when performed                         (separate procedure) Diagnosis Code(s):     --- Professional ---                        T51.761, Encounter for screening for upper                         gastrointestinal disorder                        K21.9, Gastro-esophageal reflux disease without                         esophagitis CPT copyright 2019 American  Medical Association. All rights reserved. The codes documented in this report are preliminary and upon coder review may  be revised to meet current compliance requirements. Dr. Ulyess Mort Lin Landsman MD, MD 12/01/2019 10:40:21 AM This report has been signed electronically. Number of Addenda: 0 Note Initiated On: 12/01/2019 10:16 AM Estimated Blood Loss:  Estimated blood loss: none.      [facility]

## 2019-12-01 NOTE — Anesthesia Postprocedure Evaluation (Signed)
Anesthesia Post Note  Patient: Gary Esparza  Procedure(s) Performed: COLONOSCOPY WITH PROPOFOL (N/A ) ESOPHAGOGASTRODUODENOSCOPY (EGD) WITH PROPOFOL (N/A )  Patient location during evaluation: Endoscopy Anesthesia Type: General Level of consciousness: awake and alert and oriented Pain management: pain level controlled Vital Signs Assessment: post-procedure vital signs reviewed and stable Respiratory status: spontaneous breathing Cardiovascular status: blood pressure returned to baseline Anesthetic complications: no   No complications documented.   Last Vitals:  Vitals:   12/01/19 0923 12/01/19 1056  BP: (!) 147/94 (!) 106/58  Pulse: 72 78  Resp: 18 (!) 21  Temp: (!) 35.7 C (!) 35.8 C  SpO2: 100% 98%    Last Pain:  Vitals:   12/01/19 1056  TempSrc: Tympanic  PainSc: Asleep                 Novah Nessel

## 2019-12-01 NOTE — Anesthesia Preprocedure Evaluation (Signed)
Anesthesia Evaluation  Patient identified by MRN, date of birth, ID band Patient awake    Reviewed: Allergy & Precautions, NPO status , Patient's Chart, lab work & pertinent test results  Airway Mallampati: II       Dental   Pulmonary neg pulmonary ROS,    Pulmonary exam normal        Cardiovascular hypertension, Normal cardiovascular exam     Neuro/Psych negative neurological ROS  negative psych ROS   GI/Hepatic Neg liver ROS,   Endo/Other  negative endocrine ROS  Renal/GU negative Renal ROS  negative genitourinary   Musculoskeletal negative musculoskeletal ROS (+)   Abdominal Normal abdominal exam  (+)   Peds negative pediatric ROS (+)  Hematology negative hematology ROS (+)   Anesthesia Other Findings History reviewed. No pertinent past medical history.  Reproductive/Obstetrics                             Anesthesia Physical Anesthesia Plan  ASA: II  Anesthesia Plan: General   Post-op Pain Management:    Induction: Intravenous  PONV Risk Score and Plan: Propofol infusion  Airway Management Planned: Nasal Cannula  Additional Equipment:   Intra-op Plan:   Post-operative Plan:   Informed Consent: I have reviewed the patients History and Physical, chart, labs and discussed the procedure including the risks, benefits and alternatives for the proposed anesthesia with the patient or authorized representative who has indicated his/her understanding and acceptance.     Dental advisory given  Plan Discussed with: CRNA and Surgeon  Anesthesia Plan Comments:         Anesthesia Quick Evaluation

## 2019-12-01 NOTE — Transfer of Care (Signed)
Immediate Anesthesia Transfer of Care Note  Patient: Gary Esparza  Procedure(s) Performed: COLONOSCOPY WITH PROPOFOL (N/A ) ESOPHAGOGASTRODUODENOSCOPY (EGD) WITH PROPOFOL (N/A )  Patient Location: PACU and Endoscopy Unit  Anesthesia Type:General  Level of Consciousness: drowsy  Airway & Oxygen Therapy: Patient Spontanous Breathing  Post-op Assessment: Report given to RN and Post -op Vital signs reviewed and stable  Post vital signs: Reviewed and stable  Last Vitals:  Vitals Value Taken Time  BP    Temp    Pulse 79 12/01/19 1056  Resp 18 12/01/19 1056  SpO2 98 % 12/01/19 1056  Vitals shown include unvalidated device data.  Last Pain:  Vitals:   12/01/19 0923  TempSrc: Temporal  PainSc: 0-No pain         Complications: No complications documented.

## 2019-12-02 ENCOUNTER — Other Ambulatory Visit: Payer: Self-pay | Admitting: *Deleted

## 2019-12-02 ENCOUNTER — Ambulatory Visit (INDEPENDENT_AMBULATORY_CARE_PROVIDER_SITE_OTHER): Payer: Medicare Other | Admitting: Urology

## 2019-12-02 ENCOUNTER — Other Ambulatory Visit: Payer: Self-pay

## 2019-12-02 ENCOUNTER — Encounter: Payer: Self-pay | Admitting: Gastroenterology

## 2019-12-02 ENCOUNTER — Other Ambulatory Visit
Admission: RE | Admit: 2019-12-02 | Discharge: 2019-12-02 | Disposition: A | Discharge status: Home / Self Care | Payer: Medicare Other | Attending: Urology | Admitting: Urology

## 2019-12-02 VITALS — BP 136/81 | HR 76 | Ht 67.5 in | Wt 196.0 lb

## 2019-12-02 DIAGNOSIS — R310 Gross hematuria: Secondary | ICD-10-CM

## 2019-12-02 LAB — URINALYSIS, COMPLETE (UACMP) WITH MICROSCOPIC
Bacteria, UA: NONE SEEN
Bilirubin Urine: NEGATIVE
Glucose, UA: NEGATIVE mg/dL
Hgb urine dipstick: NEGATIVE
Ketones, ur: NEGATIVE mg/dL
Leukocytes,Ua: NEGATIVE
Nitrite: NEGATIVE
Protein, ur: NEGATIVE mg/dL
RBC / HPF: NONE SEEN RBC/hpf (ref 0–5)
Specific Gravity, Urine: 1.01 (ref 1.005–1.030)
pH: 6.5 (ref 5.0–8.0)

## 2019-12-02 MED ORDER — FINASTERIDE 5 MG PO TABS: 5.0000 mg | ORAL_TABLET | Freq: Every day | ORAL | 3 refills | Status: DC

## 2019-12-02 NOTE — Patient Instructions (Signed)

## 2019-12-02 NOTE — Progress Notes (Signed)
Cystoscopy Procedure Note:  Indication: Gross hematuria  Briefly, healthy 74 year old male with distant history of TURP in 2011 and recurrent episodes of gross hematuria multiple times over the last few years, including recently with large clots.  After informed consent and discussion of the procedure and its risks, Yaacov Koziol was positioned and prepped in the standard fashion. Cystoscopy was performed with a flexible cystoscope. The urethra, bladder neck and entire bladder was visualized in a standard fashion. The prostate was moderately enlarged with prostatic regrowth. The ureteral orifices were visualized in their normal location and orientation.  Mild to moderate bladder trabeculation.  Retroflexion showed regrowth of the prostate at the bladder neck.  No evidence of tumors.  Cytology sent.  Imaging: CTU reviewed, no hydro, filling defects or masses. Small TURP defect, prostate measures 57g  Findings: No bladder lesions, significant prostatic regrowth  ------------------------------------------------------  Assessment and Plan: In summary, he is a healthy 74 year old male with a distant history of TURP in 2011 and multiple episodes of gross hematuria over the last few years, including gross hematuria with clots 2 weeks ago.  CT urogram shows no evidence of malignancy, but there is significant prostatic regrowth, which is confirmed on cystoscopy.  We discussed options at length including observation, addition of finasteride to help with urinary symptoms and recurrent gross hematuria, or HoLEP.  We discussed the risks and benefits of these options at length.  He would like to try finasteride first, as he is minimally bothered by his urinary symptoms, and only has weak stream.  IPSS score today is 14, with quality of life mixed.  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

## 2019-12-04 LAB — CYTOLOGY - NON PAP

## 2019-12-19 ENCOUNTER — Other Ambulatory Visit: Payer: Self-pay

## 2019-12-19 ENCOUNTER — Other Ambulatory Visit
Admission: RE | Admit: 2019-12-19 | Discharge: 2019-12-19 | Disposition: A | Discharge status: Home / Self Care | Payer: Medicare Other | Attending: Gastroenterology | Admitting: Gastroenterology

## 2019-12-19 ENCOUNTER — Ambulatory Visit (INDEPENDENT_AMBULATORY_CARE_PROVIDER_SITE_OTHER): Payer: Medicare Other | Admitting: Gastroenterology

## 2019-12-19 ENCOUNTER — Telehealth: Payer: Self-pay

## 2019-12-19 ENCOUNTER — Encounter: Payer: Self-pay | Admitting: Gastroenterology

## 2019-12-19 VITALS — BP 147/79 | HR 86 | Temp 97.8°F | Wt 196.5 lb

## 2019-12-19 DIAGNOSIS — R194 Change in bowel habit: Secondary | ICD-10-CM

## 2019-12-19 DIAGNOSIS — K219 Gastro-esophageal reflux disease without esophagitis: Secondary | ICD-10-CM | POA: Diagnosis not present

## 2019-12-19 NOTE — Telephone Encounter (Signed)
Did a recall

## 2019-12-19 NOTE — Telephone Encounter (Signed)
-----   Message from Toney Reil, MD sent at 12/19/2019 11:24 AM EDT ----- Regarding: Screening colonoscopy He needs screening colonoscopy in 11/2020 due to fair prepRecommend 2 day bowel prep Please put in recall note on his chart  Thanks RV

## 2019-12-19 NOTE — Progress Notes (Signed)
Arlyss Repress, MD [address]  [address]  [place], [place] [address]  Main: [phone]  Fax: [phone]    Gastroenterology Consultation  Referring Provider:     Marina Goodell, MD Primary Care Physician:  Marina Goodell, MD Primary Gastroenterologist:  Dr. Arlyss Repress Reason for Consultation:     Altered bowel habits, chronic gerd        HPI:   Gary Esparza is a 74 y.o. male referred by Dr. Marina Goodell, MD  for consultation & management of altered bowel habits.  Patient reports history of small frequent bowel movements, variable consistency, hard to lose, string-like, nonbloody.  Reports incomplete evacuation, abdominal bloating.  He goes about 3-4 times a day.  He reports that he does not consume fruits and vegetables daily.  He also reports mild lower abdominal discomfort.  He is taking Benefiber once a day.  He has not tried any stool softeners.  He denies any weight loss  Chronic GERD: Patient reports chronic burning in his throat and regurgitation of acid which is intermittent, triggered after eating certain foods.  Patient cannot eat late at night, reports nocturnal heartburn.  He takes acid suppressive medication as needed only.  He never had an upper endoscopy  Patient does not smoke or drink alcohol Patient retired from [employer], moved from [place] few years ago to live with his girlfriend He denies any abdominal surgeries  Follow-up visit 12/19/2019 Patient underwent upper endoscopy which was unremarkable.  Colonoscopy revealed diverticulosis only.  He continues to have altered bowel habits, he tried fiber supplements which did not help.  He is trying to cut back on high-calorie foods as well.  Currently, he has 2-3 soft bowel movements daily associated with abdominal bloating.  He does have intermittent acid reflux especially at night, currently not on any acid suppressive medication   NSAIDs: None  Antiplts/Anticoagulants/Anti  thrombotics: None  GI Procedures: Colonoscopy several years ago, reportedly normal  EGD and colonoscopy 12/01/2019 - Normal esophagus and gastroesophageal junction. - Normal stomach. - Normal examined duodenum. - Esophagogastric landmarks identified. - No specimens collected.  - Preparation of the colon was fair. - Diverticulosis in the entire examined colon. - The examination was otherwise normal. - The distal rectum and anal verge are normal on retroflexion view. - No specimens collected.   History reviewed. No pertinent past medical history.  Past Surgical History:  Procedure Laterality Date  . COLONOSCOPY WITH PROPOFOL N/A 12/01/2019   Procedure: COLONOSCOPY WITH PROPOFOL;  Surgeon: Toney Reil, MD;  Location: [facility] ENDOSCOPY;  Service: Gastroenterology;  Laterality: N/A;  . ESOPHAGOGASTRODUODENOSCOPY (EGD) WITH PROPOFOL N/A 12/01/2019   Procedure: ESOPHAGOGASTRODUODENOSCOPY (EGD) WITH PROPOFOL;  Surgeon: Toney Reil, MD;  Location: [facility] ENDOSCOPY;  Service: Gastroenterology;  Laterality: N/A;    Current Outpatient Medications:  .  Cholecalciferol (VITAMIN D-3) 125 MCG (5000 UT) TABS, Take by mouth., Disp: , Rfl:  .  Corn Dextrin ([employer] EASY FIBER) POWD, Take by mouth., Disp: , Rfl:  .  finasteride (PROSCAR) 5 MG tablet, Take 1 tablet (5 mg total) by mouth daily., Disp: 90 tablet, Rfl: 3 .  losartan (COZAAR) 25 MG tablet, Take by mouth., Disp: , Rfl:  .  Multiple Vitamins-Minerals (VITRUM SENIOR) TABS, Take by mouth., Disp: , Rfl:  .  pravastatin (PRAVACHOL) 20 MG tablet, Take by mouth., Disp: , Rfl:  .  Probiotic Product (PROBIOTIC-10 PO), Take by mouth., Disp: , Rfl:    History reviewed. No pertinent  family history.   Social History   Tobacco Use  . Smoking status: Never Smoker  . Smokeless tobacco: Never Used  Vaping Use  . Vaping Use: Never used  Substance Use Topics  . Alcohol use: Yes    Comment: 2 a month   . Drug use: Never    Allergies as of  12/19/2019 - Review Complete 12/19/2019  Allergen Reaction Noted  . Other Rash 08/27/2019    Review of Systems:    All systems reviewed and negative except where noted in HPI.   Physical Exam:  BP (!) 147/79 (BP Location: Left Arm, Patient Position: Sitting, Cuff Size: Normal)   Pulse 86   Temp 97.8 F (36.6 C) (Oral)   Wt 196 lb 8 oz (89.1 kg)   BMI 30.32 kg/m  No LMP for male patient.  General:   Alert,  Well-developed, well-nourished, pleasant and cooperative in NAD Head:  Normocephalic and atraumatic. Eyes:  Sclera clear, no icterus.   Conjunctiva pink. Ears:  Normal auditory acuity. Nose:  No deformity, discharge, or lesions. Mouth:  No deformity or lesions,oropharynx pink & moist. Neck:  Supple; no masses or thyromegaly. Lungs:  Respirations even and unlabored.  Clear throughout to auscultation.   No wheezes, crackles, or rhonchi. No acute distress. Heart:  Regular rate and rhythm; no murmurs, clicks, rubs, or gallops. Abdomen:  Normal bowel sounds. Soft, non-tender and moderately distended, tympanic to percussion without masses, hepatosplenomegaly or hernias noted.  No guarding or rebound tenderness.   Rectal: Not performed Msk: Prominent and enlarged right clavicular bone, compared to left. Good, equal movement & strength bilaterally. Pulses:  Normal pulses noted. Extremities:  No clubbing or edema.  No cyanosis. Neurologic:  Alert and oriented x3;  grossly normal neurologically. Skin:  Intact without significant lesions or rashes. No jaundice. Psych:  Alert and cooperative. Normal mood and affect.  Imaging Studies: No abdominal imaging  Assessment and Plan:   Gary Esparza is a 74 y.o. male with no significant past medical history is seen in consultation for altered bowel habits and chronic GERD  Altered bowel habits with incomplete evacuation and abdominal bloating Colonoscopy was unremarkable Fiber supplements were not helpful Check celiac disease  panel Will empirically try rifaximin 550 mg 3 times daily for abdominal bloating if celiac panel is negative  Chronic GERD Continue antireflux lifestyle, information provided EGD is unremarkable  Recommend screening colonoscopy in 1 year due to fair prep and patient is agreeable   Follow up as needed   Arlyss Repress, MD

## 2019-12-23 ENCOUNTER — Telehealth: Payer: Self-pay

## 2019-12-23 LAB — CELIAC DISEASE PANEL
Endomysial Ab, IgA: NEGATIVE
IgA: 205 mg/dL (ref 61–437)
Tissue Transglutaminase Ab, IgA: <2 U/mL (ref 0–3)

## 2019-12-23 NOTE — Telephone Encounter (Signed)
Called and left a message for call back  

## 2019-12-23 NOTE — Telephone Encounter (Signed)
-----   Message from Toney Reil, MD sent at 12/23/2019  4:43 PM EDT ----- Celiac panel is negative.  Recommend trial of rifaximin 550g 3 times daily for abdominal bloatingRohini Vanga

## 2019-12-24 MED ORDER — RIFAXIMIN 550 MG PO TABS: 550.0000 mg | ORAL_TABLET | Freq: Three times a day (TID) | ORAL | 0 refills | Status: AC

## 2019-12-24 NOTE — Telephone Encounter (Signed)
Patient verbalized understanding. He states he would like prescription sent to [facility] in [place]. Informed patient it might need a prior authorization but if it does I would send the information to insurance company. Patient prescription insurance is [employer]

## 2020-01-28 ENCOUNTER — Telehealth: Payer: Self-pay

## 2020-01-28 NOTE — Telephone Encounter (Signed)
Received a PA for patient Xifaxan through covermymeds. Submitted the PA and sent the last office visit notes through cover my meds. Pa is pending

## 2020-02-25 ENCOUNTER — Telehealth: Payer: Self-pay | Admitting: Gastroenterology

## 2020-02-25 NOTE — Telephone Encounter (Signed)
I never received message from patient but checked on cover my meds and medication was denied. Please advised what you recommend

## 2020-02-25 NOTE — Telephone Encounter (Signed)
Patient states he left a voicemail for Dr. Allegra Lai back on 10.14.21 and never heard back. Pt wanting to know if medication xifaxian was ever approved by ins or if Dr. Allegra Lai wanted him to try something else. Please advise.

## 2020-02-25 NOTE — Telephone Encounter (Signed)
Patient verbalized understanding he will pick up samples in [place]. Please give patient 3-4 boxes of Linzess 145 he will come to [place] to pick them up

## 2020-02-25 NOTE — Telephone Encounter (Signed)
Let us try low dose linzess daily, he can pick up samples  [facility]

## 2020-02-25 NOTE — Telephone Encounter (Signed)
Linzess samples have been put up front for pt to pick up.

## 2020-03-03 ENCOUNTER — Other Ambulatory Visit: Payer: Self-pay | Admitting: Ophthalmology

## 2020-03-03 ENCOUNTER — Other Ambulatory Visit (HOSPITAL_COMMUNITY): Payer: Self-pay | Admitting: Ophthalmology

## 2020-03-03 DIAGNOSIS — H532 Diplopia: Secondary | ICD-10-CM

## 2020-03-17 ENCOUNTER — Other Ambulatory Visit: Payer: Self-pay

## 2020-03-17 ENCOUNTER — Ambulatory Visit
Admission: RE | Admit: 2020-03-17 | Discharge: 2020-03-17 | Disposition: A | Discharge status: Home / Self Care | Payer: Medicare Other | Source: Ambulatory Visit | Attending: Ophthalmology | Admitting: Ophthalmology

## 2020-03-17 DIAGNOSIS — H532 Diplopia: Secondary | ICD-10-CM | POA: Diagnosis present

## 2020-03-17 IMAGING — MR MR ORBITS WO/W CM
5 of 7 series · 27 of 48 positions shown · IV contrast (gadavist)
Comparison: No pertinent prior exams available for comparison.

CLINICAL DATA: Diplopia. Additional history provided by scanning
technologist: Patient reports episodes of "double vision" and "heat
wave looking areas at the sides of his vision." Symptoms for 2-3
months.

EXAM:
MRI OF THE ORBITS WITHOUT AND WITH CONTRAST
TECHNIQUE: Multiplanar, multisequence MR imaging of the orbits was performed
both before and after the administration of intravenous contrast.
CONTRAST:  7.5mL GADAVIST GADOBUTROL 1 MMOL/ML IV SOLN

[Series 7: T2 · axial · 3.0mm · 0.78mm/px · z∈[-48,+16]mm · 4 of 17 slices shown (1 of 2)]
[im 1/17]
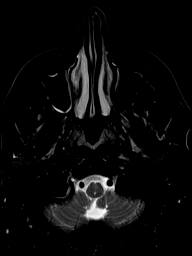
[im 6/17]
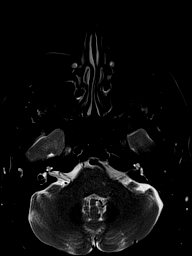
[im 11/17]
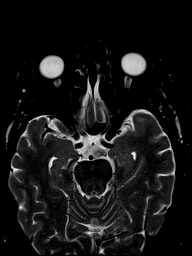
[im 17/17]
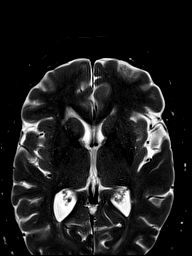

[Series 9: T2 · coronal · 3.0mm · 0.78mm/px · 8 of 35 slices shown (2 of 2)]
[im 1/35]
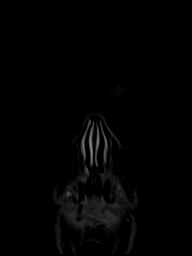
[im 5/35]
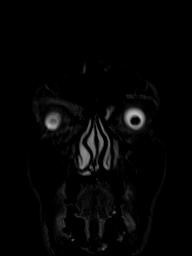
[im 10/35]
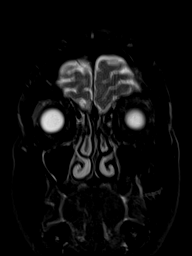
[im 15/35]
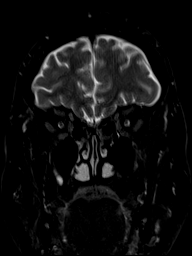
[im 20/35]
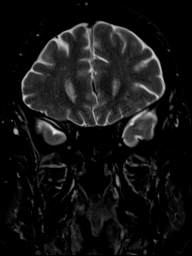
[im 25/35]
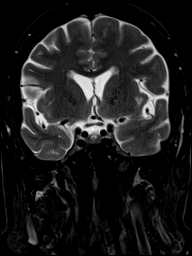
[im 30/35]
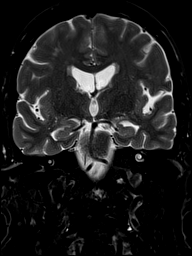
[im 35/35]
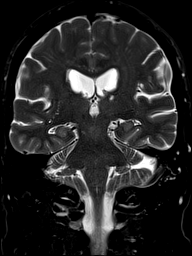

[Series 10: T1 · axial · non-contrast · 3.0mm · 0.31mm/px · z∈[-49,+16]mm · 5 of 23 slices shown (1 of 3)]
[im 1/23]
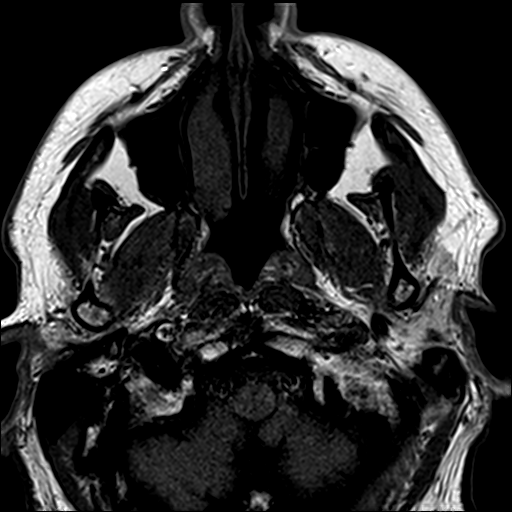
[im 6/23]
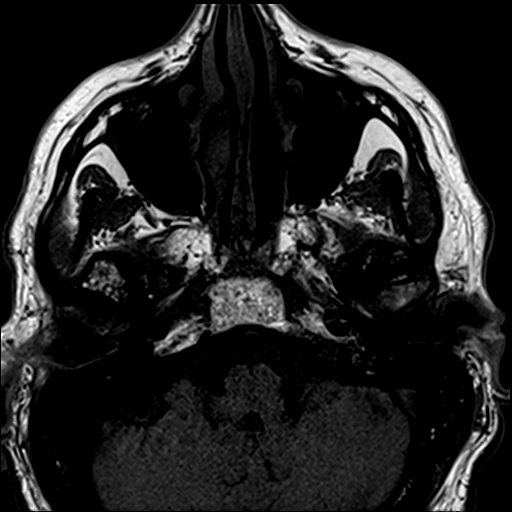
[im 12/23]
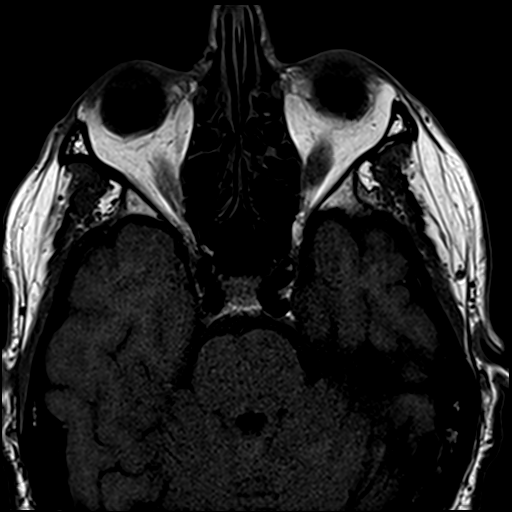
[im 17/23]
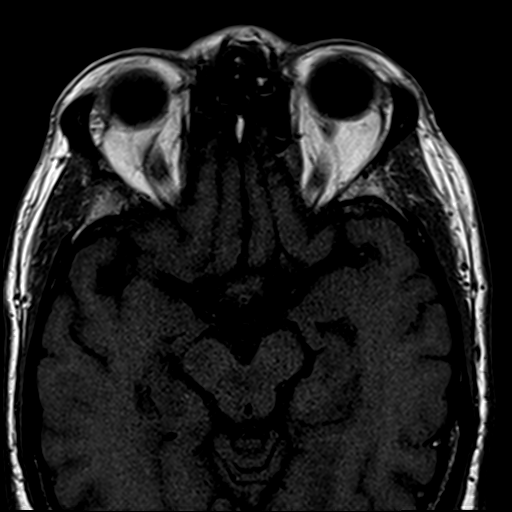
[im 23/23]
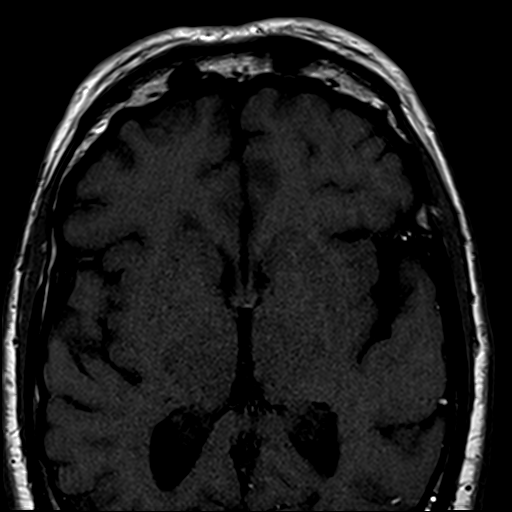

[Series 11: T1 · coronal · non-contrast · 3.0mm · 0.35mm/px · 9 of 40 slices shown (2 of 3)]
[im 1/40]
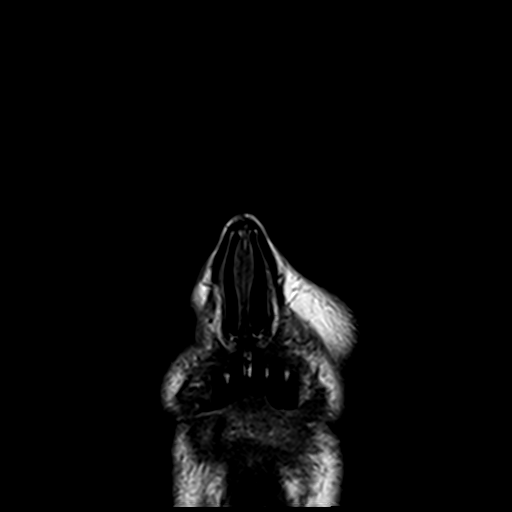
[im 5/40]
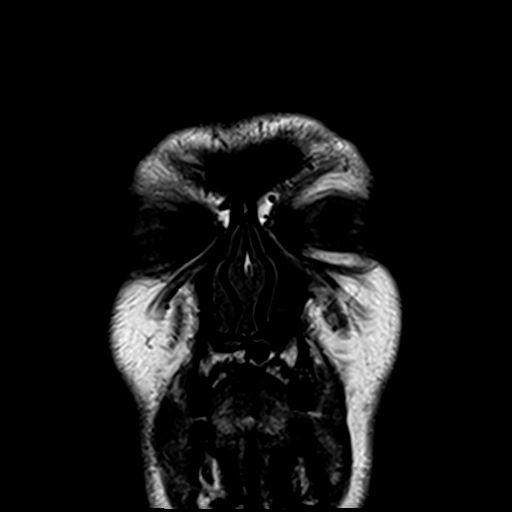
[im 10/40]
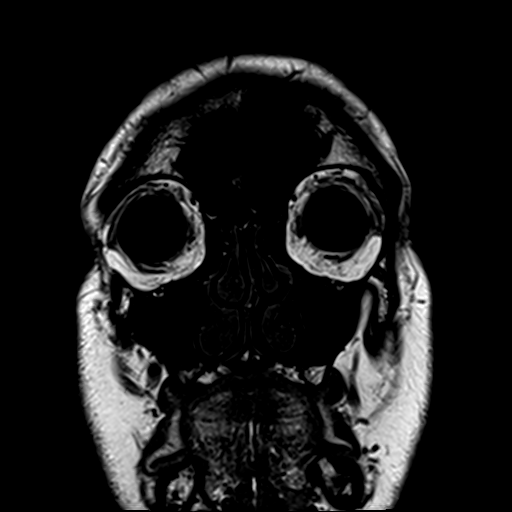
[im 15/40]
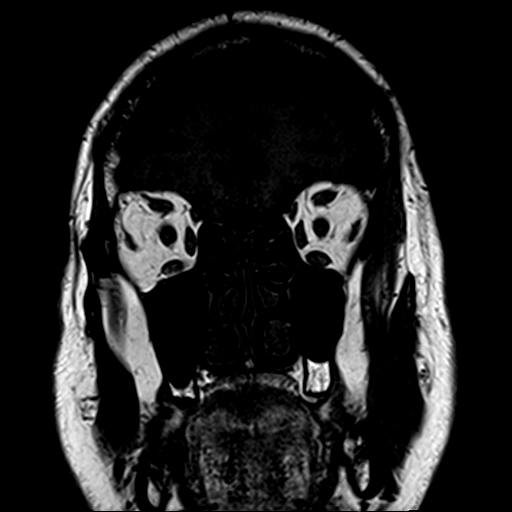
[im 20/40]
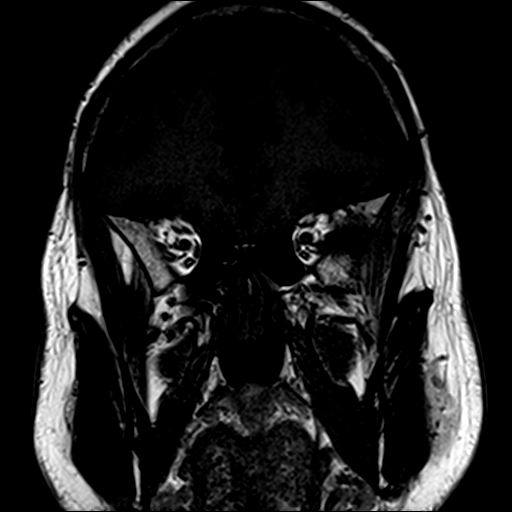
[im 25/40]
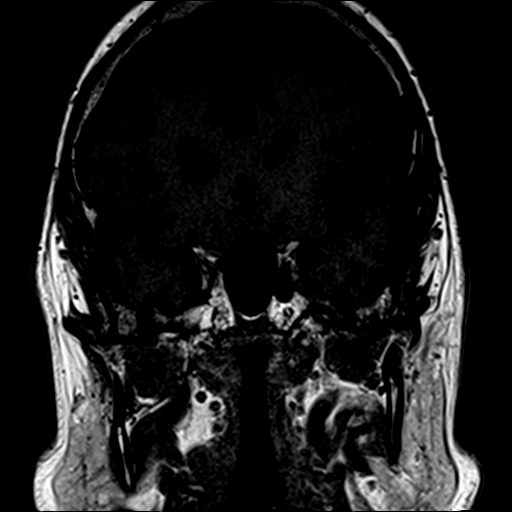
[im 30/40]
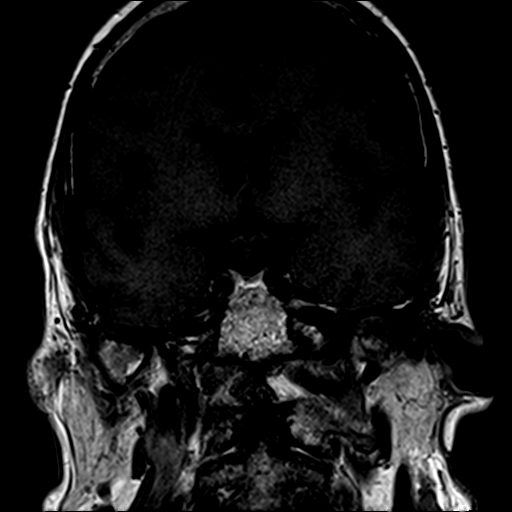
[im 35/40]
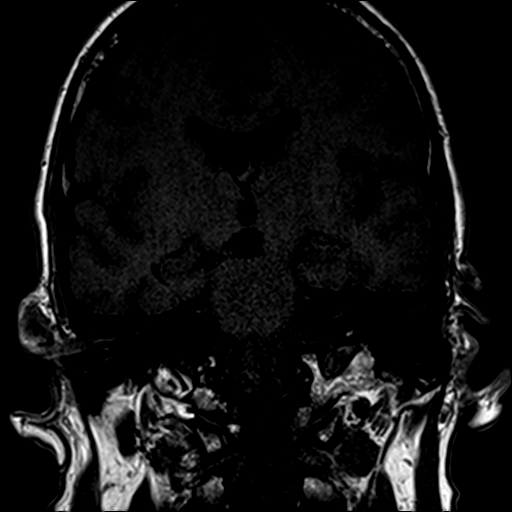
[im 40/40]
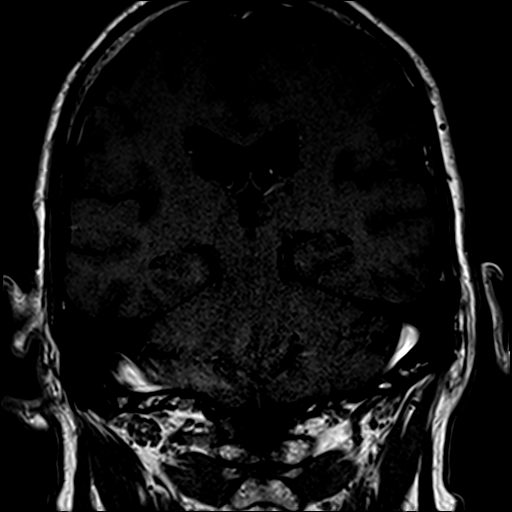

[Series 12: T1 · sagittal · 3.0mm · 0.38mm/px · 1 of 34 slices shown (3 of 3)]
[im 1/34]
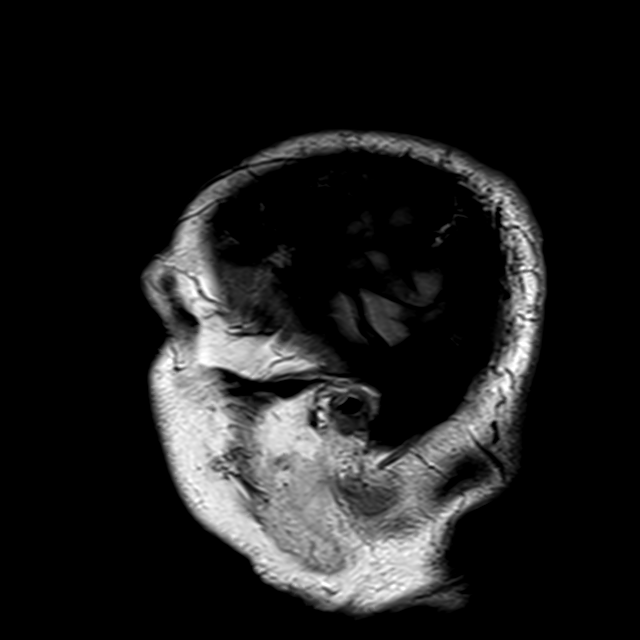

[27 of 48 positions shown; findings below may reference images not displayed]

FINDINGS: Orbits: The globes are normal in size and contour. The extraocular
muscles and optic nerve sheath complexes are symmetric and
unremarkable. No intraorbital mass or abnormal intraorbital
enhancement is demonstrated.

Visualized sinuses: Mild ethmoid and right maxillary sinus mucosal
thickening.

Soft tissues: No acute or significant finding.

Limited intracranial: Chronic lacunar infarcts within the left
caudate nucleus and left cerebellum. Background mild chronic small
vessel ischemic disease. The suprasellar cistern is patent. Normal
appearance of the optic chiasm.

Other: Trace left mastoid effusion.
IMPRESSION: Unremarkable MRI appearance of the orbits with and without contrast.
No orbital mass or acute intraorbital abnormality is identified.

Chronic lacunar infarcts within the left caudate nucleus and left
cerebellum. Background mild chronic small vessel ischemic disease
within the cerebral white matter.

Mild ethmoid and right maxillary sinus mucosal thickening.

Trace left mastoid effusion.

## 2020-03-17 MED ORDER — GADOBUTROL 1 MMOL/ML IV SOLN
7.5000 mL | Freq: Once | INTRAVENOUS | Status: AC | PRN
  Administered 2020-03-17: 7.5 mL via INTRAVENOUS

## 2020-06-15 ENCOUNTER — Other Ambulatory Visit: Payer: Self-pay | Admitting: Neurology

## 2020-06-15 DIAGNOSIS — I6381 Other cerebral infarction due to occlusion or stenosis of small artery: Secondary | ICD-10-CM

## 2020-06-30 ENCOUNTER — Other Ambulatory Visit: Payer: Self-pay

## 2020-06-30 ENCOUNTER — Ambulatory Visit
Admission: RE | Admit: 2020-06-30 | Discharge: 2020-06-30 | Disposition: A | Discharge status: Home / Self Care | Payer: Medicare Other | Source: Ambulatory Visit | Attending: Neurology | Admitting: Neurology

## 2020-06-30 DIAGNOSIS — I6381 Other cerebral infarction due to occlusion or stenosis of small artery: Secondary | ICD-10-CM | POA: Diagnosis not present

## 2020-06-30 IMAGING — MR MR MRA HEAD W/O CM
1 series · 19 of 48 positions shown · non-contrast
Comparison: MRI of the brain [DATE].

CLINICAL DATA: Lacunar stroke

EXAM:
MRI HEAD WITHOUT CONTRAST
MRA HEAD WITHOUT CONTRAST
TECHNIQUE: Multiplanar, multiecho pulse sequences of the brain and surrounding
structures were obtained without intravenous contrast. Angiographic
images of the head were obtained using MRA technique without
contrast.

[Series 7: TOF · axial · 0.5mm · 0.41mm/px · z∈[-100,-2]mm · 19 of 205 slices shown]
[im 1/205]
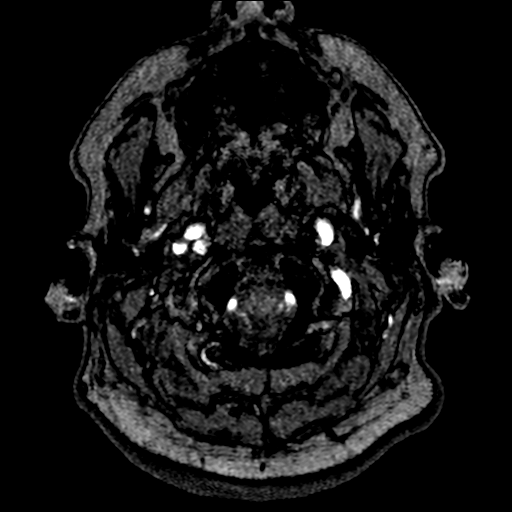
[im 5/205]
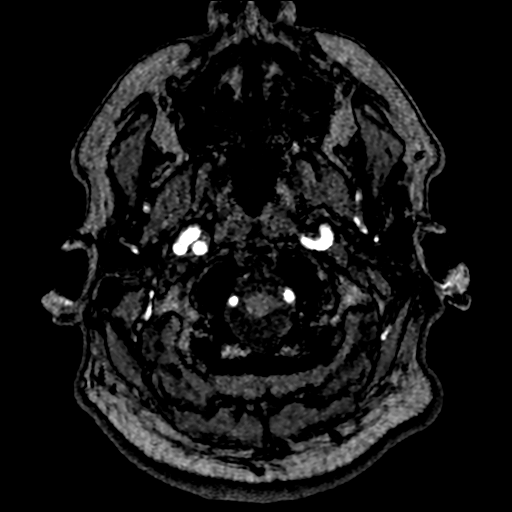
[im 9/205]
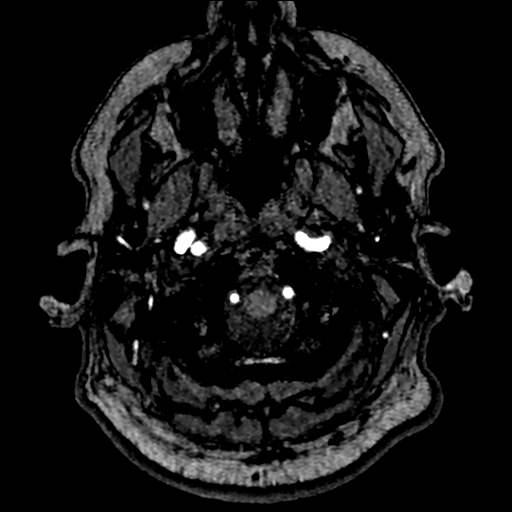
[im 14/205]
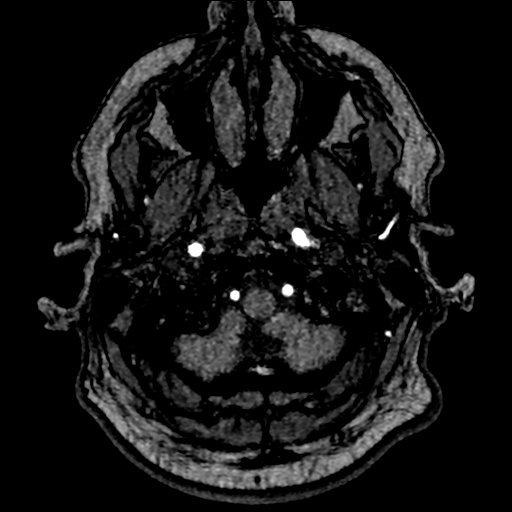
[im 18/205]
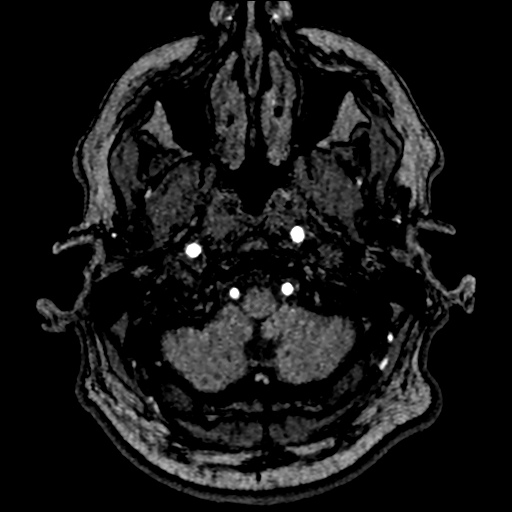
[im 22/205]
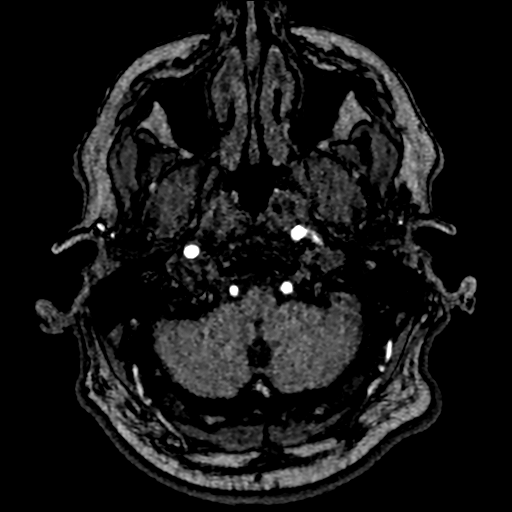
[im 27/205]
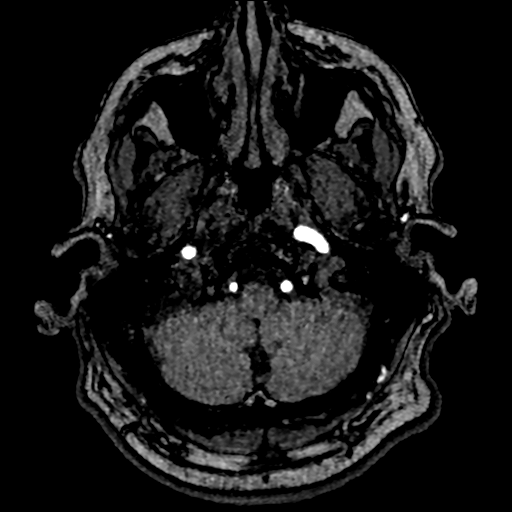
[im 31/205]
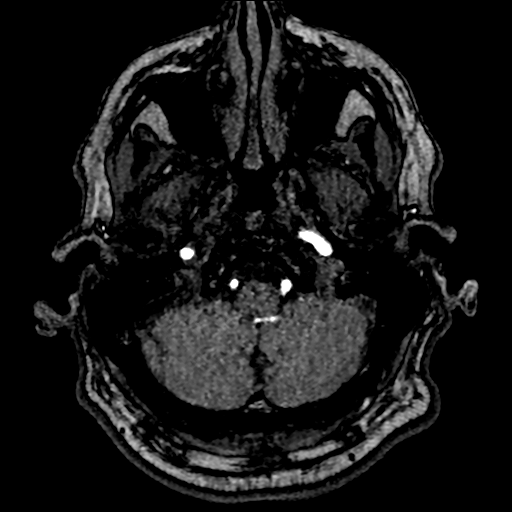
[im 35/205]
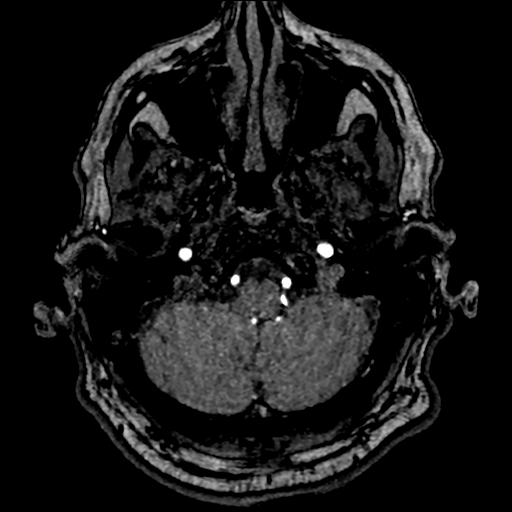
[im 40/205]
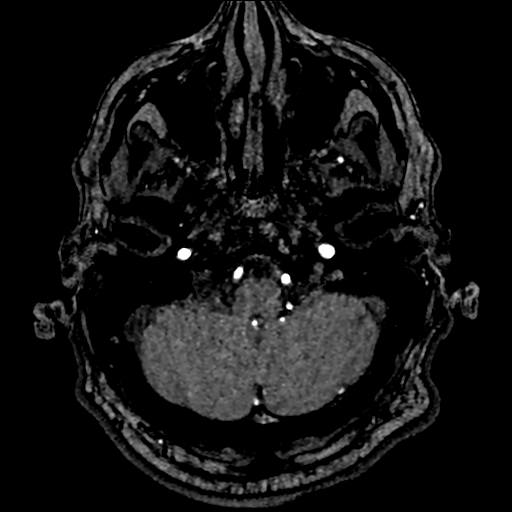
[im 44/205]
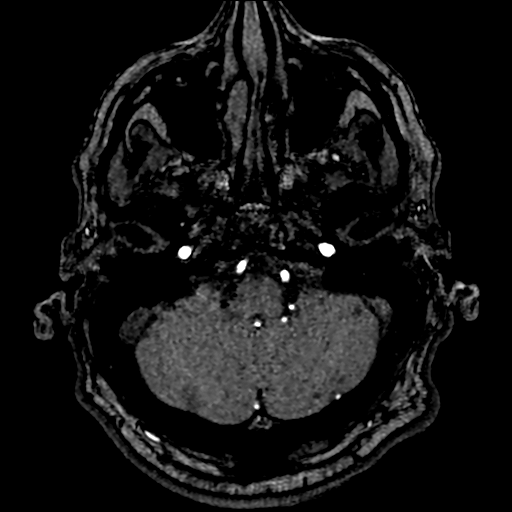
[im 66/205]
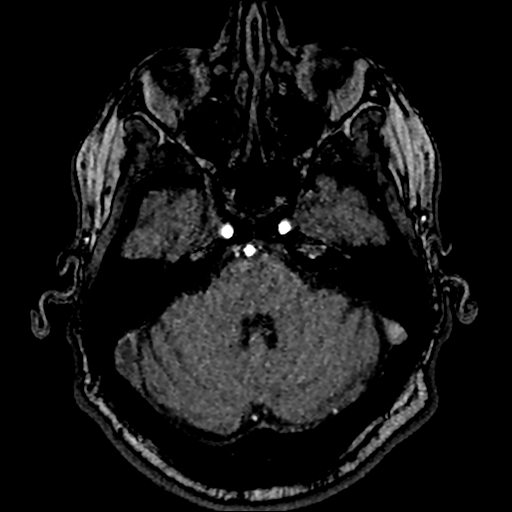
[im 92/205]
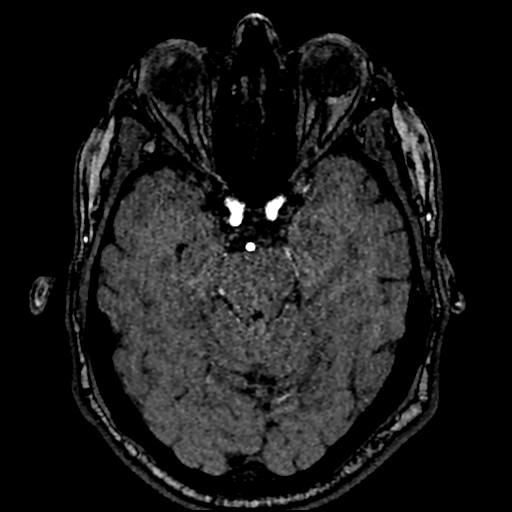
[im 105/205]
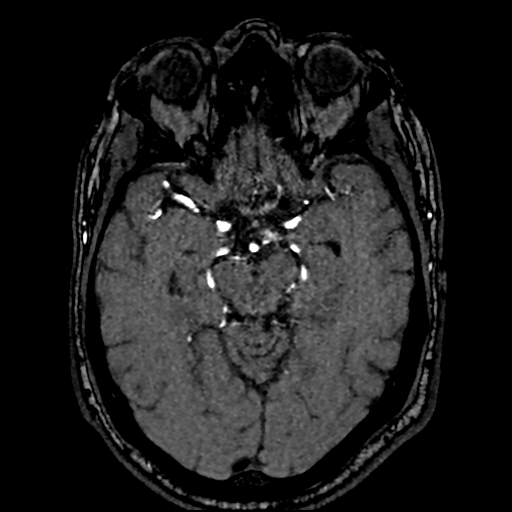
[im 118/205]
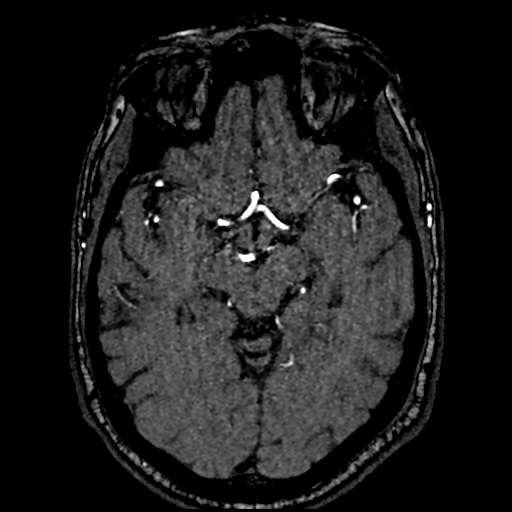
[im 144/205]
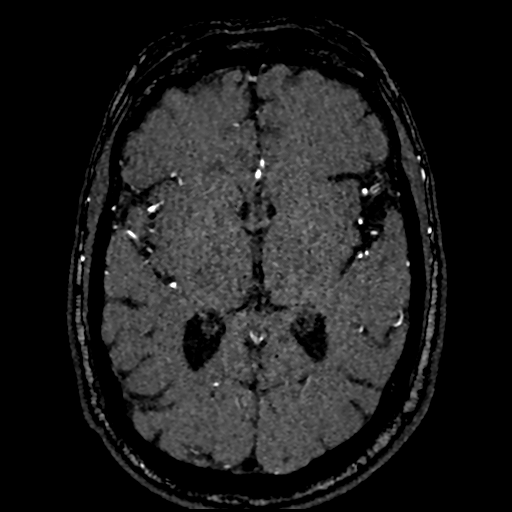
[im 170/205]
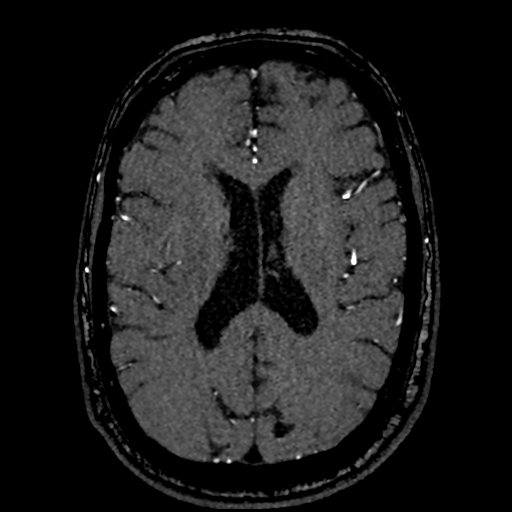
[im 174/205]
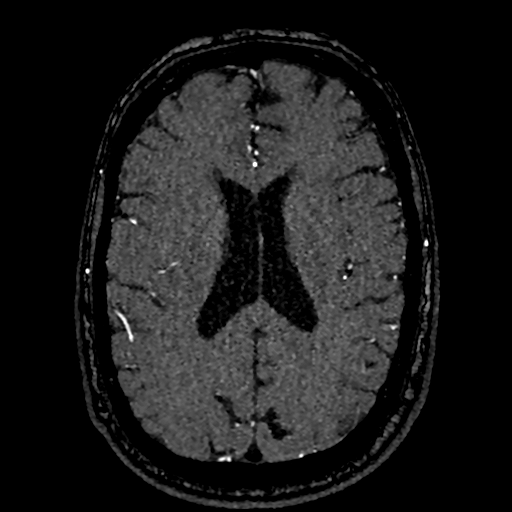
[im 196/205]
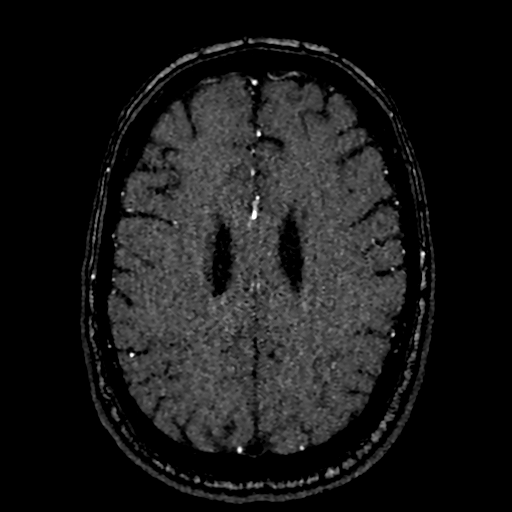

[19 of 48 positions shown; findings below may reference images not displayed]

FINDINGS: MRI HEAD FINDINGS

Brain: No acute infarction, hemorrhage, hydrocephalus, extra-axial
collection or mass lesion. Remote lacunar infarcts are again seen in
the left cerebellar hemisphere and bilateral caudate nuclei.
Scattered foci of T2 hyperintensity are seen within the white matter
of the cerebral hemispheres, nonspecific, most likely related to
chronic microangiopathic changes

Vascular: Normal flow voids.

Skull and upper cervical spine: Normal marrow signal.

Sinuses/Orbits: Negative.

Other: Mild left mastoid effusion.

MRA HEAD FINDINGS

The visualized portions of the distal cervical and intracranial
internal carotid arteries are widely patent with normal flow related
enhancement. The bilateral anterior cerebral arteries and middle
cerebral arteries are widely patent with antegrade flow without
high-grade flow-limiting stenosis or proximal branch occlusion. No
intracranial aneurysm within the anterior circulation.

The vertebral arteries are widely patent with antegrade flow. The
vertebrobasilar junction and basilar artery are widely patent with
antegrade flow without evidence of basilar stenosis or aneurysm.
Posterior cerebral arteries are normal bilaterally. No intracranial
aneurysm within the posterior circulation.
IMPRESSION: 1. No acute intracranial abnormality.
2. Remote lacunar infarcts in the left cerebellar hemisphere and
bilateral caudate head.
3. Mild chronic microangiopathic changes.
4. Unremarkable MRA of the head.

## 2020-06-30 IMAGING — MR MR HEAD W/O CM
11 series · 45 of 48 positions shown · non-contrast
Comparison: MRI of the brain [DATE].

CLINICAL DATA: Lacunar stroke

EXAM:
MRI HEAD WITHOUT CONTRAST
MRA HEAD WITHOUT CONTRAST
TECHNIQUE: Multiplanar, multiecho pulse sequences of the brain and surrounding
structures were obtained without intravenous contrast. Angiographic
images of the head were obtained using MRA technique without
contrast.

[Series 5: cor dwi_tracew · coronal · 5.0mm · 0.65mm/px · 3 of 40 slices shown]
[im 1/40]
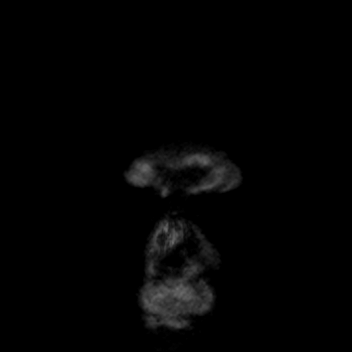
[im 20/40]
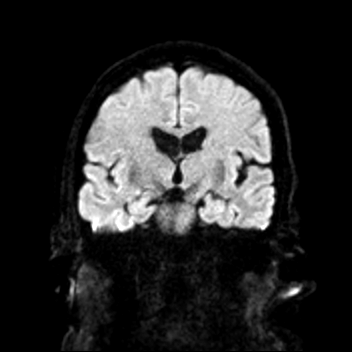
[im 40/40]
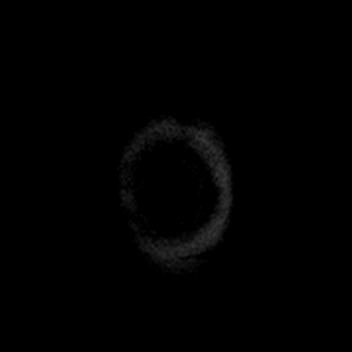

[Series 5: ax dwi_tracew · axial · 3.0mm · 0.65mm/px · z∈[-104,+51]mm · 4 of 48 slices shown]
[im 1/48]
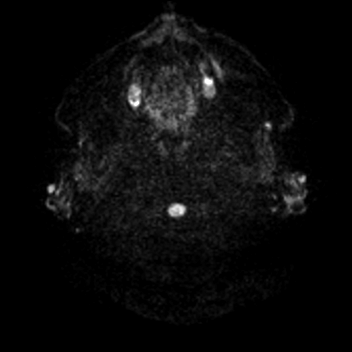
[im 16/48]
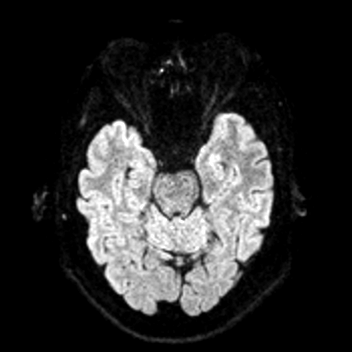
[im 32/48]
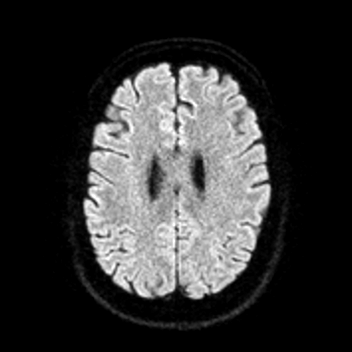
[im 48/48]
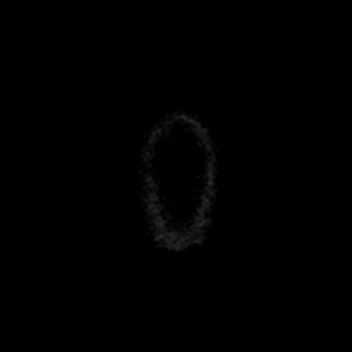

[Series 6: ax dwi_adc · axial · 3.0mm · 0.65mm/px · z∈[-104,+51]mm · 4 of 48 slices shown]
[im 1/48]
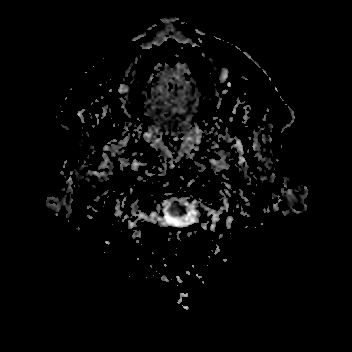
[im 16/48]
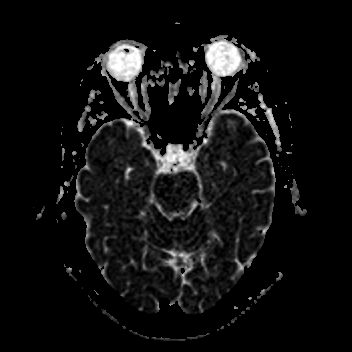
[im 32/48]
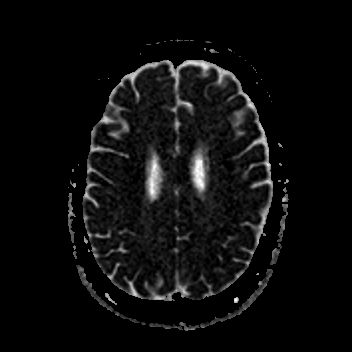
[im 48/48]
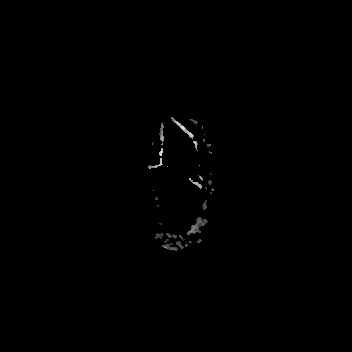

[Series 6: cor dwi_adc · coronal · 5.0mm · 0.65mm/px · 3 of 40 slices shown]
[im 1/40]
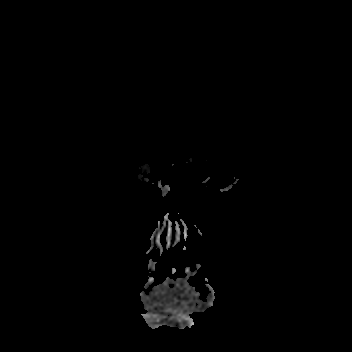
[im 20/40]
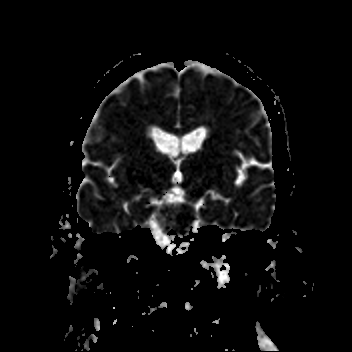
[im 40/40]
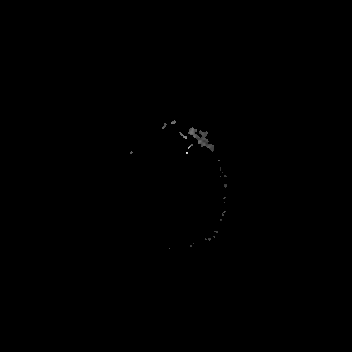

[Series 7: T1 · sagittal · 5.0mm · 0.62mm/px · 2 of 21 slices shown (1 of 2)]
[im 1/21]
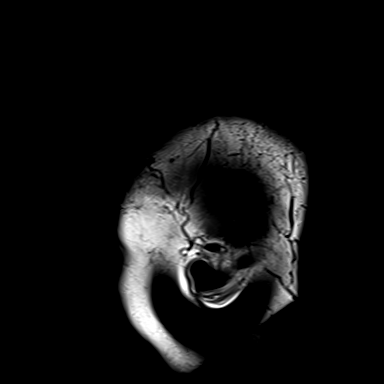
[im 21/21]
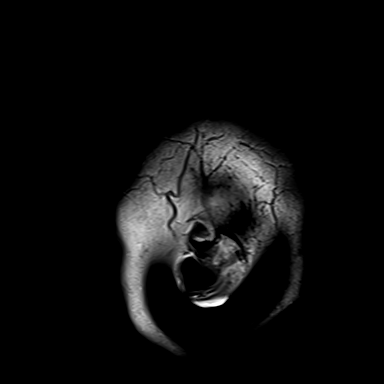

[Series 8: T2 · axial · 5.0mm · 0.53mm/px · z∈[-98,+46]mm · 2 of 25 slices shown (1 of 2)]
[im 1/25]
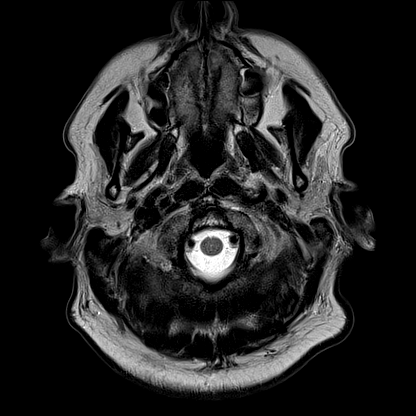
[im 25/25]
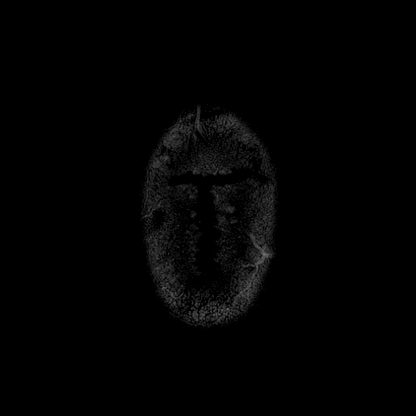

[Series 10: pha_images · axial · 3.0mm · 0.90mm/px · z∈[-115,+56]mm · 5 of 57 slices shown]
[im 1/57]
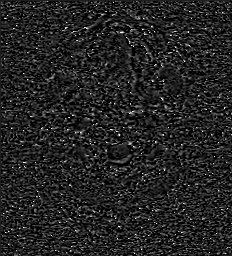
[im 15/57]
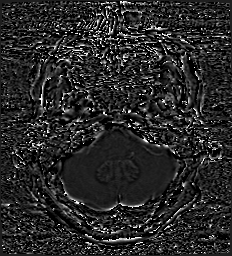
[im 29/57]
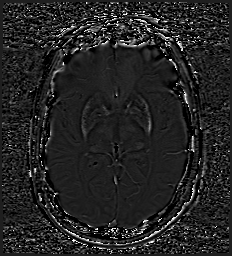
[im 43/57]
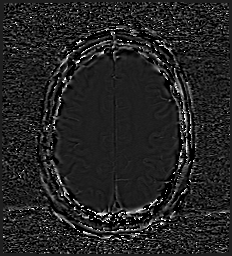
[im 57/57]
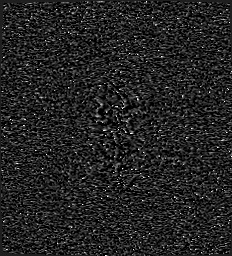

[Series 11: swi_images · axial · 3.0mm · 0.90mm/px · z∈[-115,+62]mm · 5 of 60 slices shown]
[im 1/60]
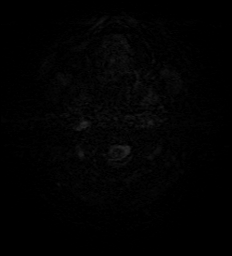
[im 15/60]
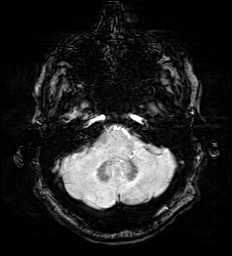
[im 30/60]
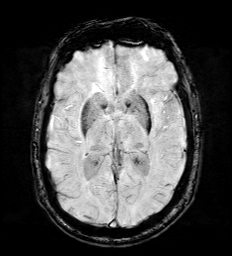
[im 45/60]
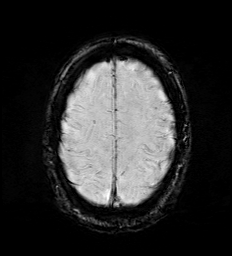
[im 60/60]
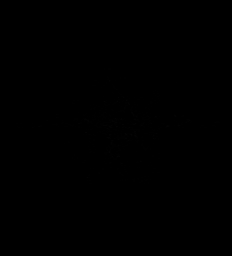

[Series 13: FLAIR · axial · 3.0mm · 0.53mm/px · z∈[-107,+55]mm · 4 of 55 slices shown]
[im 1/55]
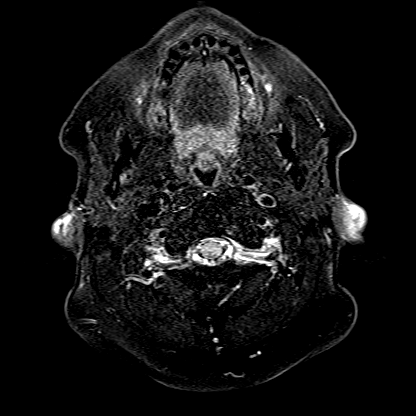
[im 19/55]
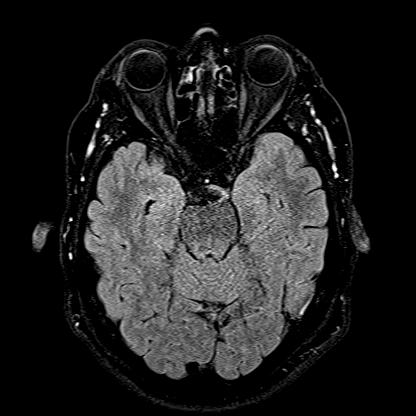
[im 37/55]
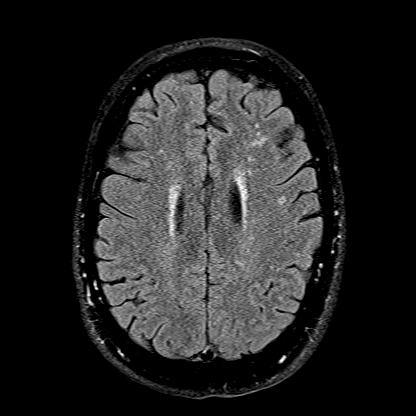
[im 55/55]
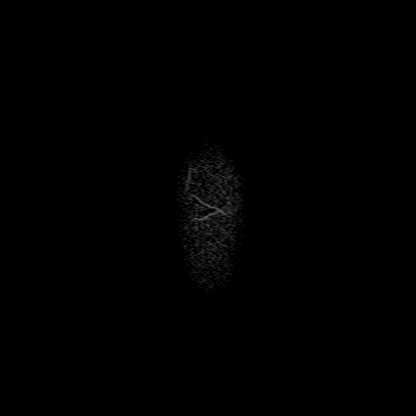

[Series 14: T1 · axial · 1.0mm · 0.98mm/px · z∈[-114,+59]mm · 11 of 175 slices shown (2 of 2)]
[im 1/175]
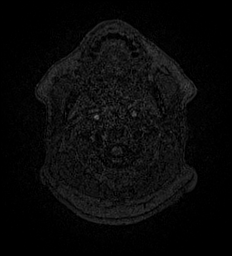
[im 14/175]
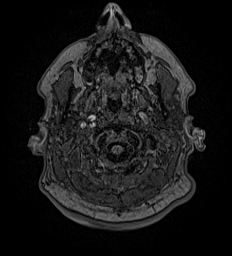
[im 27/175]
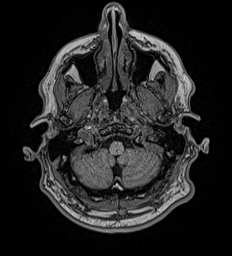
[im 41/175]
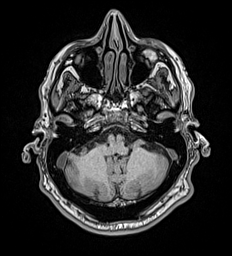
[im 54/175]
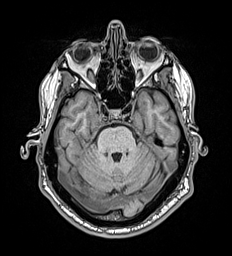
[im 67/175]
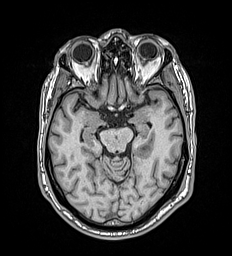
[im 81/175]
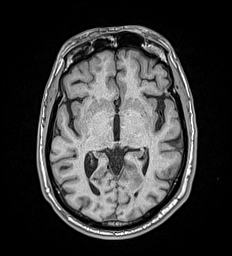
[im 94/175]
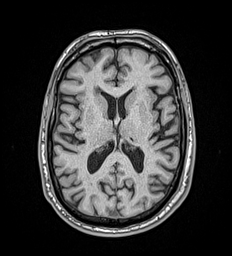
[im 121/175]
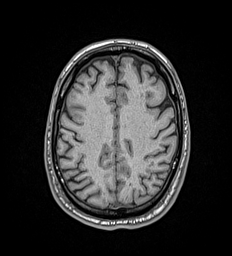
[im 148/175]
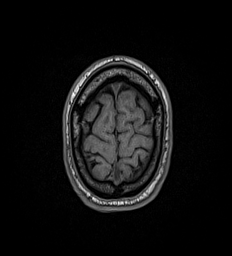
[im 175/175]
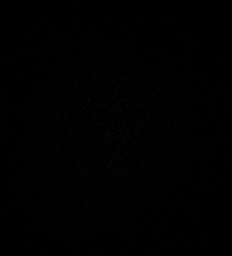

[Series 15: T2 · coronal · 5.0mm · 0.57mm/px · 2 of 29 slices shown (2 of 2)]
[im 1/29]
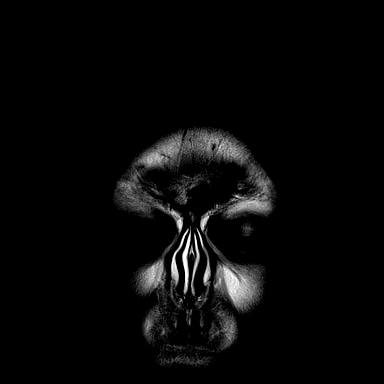
[im 29/29]
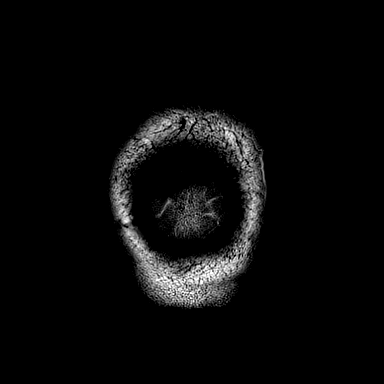

[45 of 48 positions shown; findings below may reference images not displayed]

FINDINGS: MRI HEAD FINDINGS

Brain: No acute infarction, hemorrhage, hydrocephalus, extra-axial
collection or mass lesion. Remote lacunar infarcts are again seen in
the left cerebellar hemisphere and bilateral caudate nuclei.
Scattered foci of T2 hyperintensity are seen within the white matter
of the cerebral hemispheres, nonspecific, most likely related to
chronic microangiopathic changes

Vascular: Normal flow voids.

Skull and upper cervical spine: Normal marrow signal.

Sinuses/Orbits: Negative.

Other: Mild left mastoid effusion.

MRA HEAD FINDINGS

The visualized portions of the distal cervical and intracranial
internal carotid arteries are widely patent with normal flow related
enhancement. The bilateral anterior cerebral arteries and middle
cerebral arteries are widely patent with antegrade flow without
high-grade flow-limiting stenosis or proximal branch occlusion. No
intracranial aneurysm within the anterior circulation.

The vertebral arteries are widely patent with antegrade flow. The
vertebrobasilar junction and basilar artery are widely patent with
antegrade flow without evidence of basilar stenosis or aneurysm.
Posterior cerebral arteries are normal bilaterally. No intracranial
aneurysm within the posterior circulation.
IMPRESSION: 1. No acute intracranial abnormality.
2. Remote lacunar infarcts in the left cerebellar hemisphere and
bilateral caudate head.
3. Mild chronic microangiopathic changes.
4. Unremarkable MRA of the head.

## 2020-08-24 ENCOUNTER — Encounter: Payer: Self-pay | Admitting: Urology

## 2020-08-24 ENCOUNTER — Other Ambulatory Visit: Payer: Self-pay

## 2020-08-24 ENCOUNTER — Ambulatory Visit (INDEPENDENT_AMBULATORY_CARE_PROVIDER_SITE_OTHER): Payer: Medicare Other | Admitting: Urology

## 2020-08-24 VITALS — BP 144/80 | HR 86 | Ht 68.0 in | Wt 201.0 lb

## 2020-08-24 DIAGNOSIS — N529 Male erectile dysfunction, unspecified: Secondary | ICD-10-CM | POA: Diagnosis not present

## 2020-08-24 DIAGNOSIS — N401 Enlarged prostate with lower urinary tract symptoms: Secondary | ICD-10-CM | POA: Diagnosis not present

## 2020-08-24 DIAGNOSIS — N138 Other obstructive and reflux uropathy: Secondary | ICD-10-CM

## 2020-08-24 DIAGNOSIS — R310 Gross hematuria: Secondary | ICD-10-CM | POA: Diagnosis not present

## 2020-08-24 LAB — BLADDER SCAN AMB NON-IMAGING

## 2020-08-24 MED ORDER — TADALAFIL 5 MG PO TABS: 5.0000 mg | ORAL_TABLET | Freq: Every day | ORAL | 11 refills | Status: DC | PRN

## 2020-08-24 NOTE — Progress Notes (Signed)
   08/24/2020 1:16 PM   Gary Esparza [birth year] [id number]  Reason for visit: Follow up gross hematuria/BPH, new ED  HPI: I saw [name] back in clinic for the above issues.  Briefly he is a healthy 75 year old male with a history of TURP in 2011 and he was having recurrent episodes of significant gross hematuria over the last few years including with significant clots.  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  Cytology was negative.  He opted for trial of finasteride at that time.  He reports he is done very well over the last 9 months with only one episode of light pink urine.  He denies any significant urinary symptoms.  PVR today is 0 mL.  He also reports trouble with erections over the last 1 to 2 years.  He is able to get, but has difficulty maintaining erections.  He is never tried any medications for this.  He does not take any nitrates for chest pain.  We discussed the risks and benefits of the PDE 5 inhibitors, and I recommended a trial of Cialis 5 to 10 mg to take either on demand, or every other day.  He will let us know if this medication is not working for him.  Good Rx coupon provided.  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

## 2020-08-24 NOTE — Patient Instructions (Signed)
Tadalafil tablets (Cialis) What is this medicine? TADALAFIL (tah DA la fil) is used to treat erection problems in men. It is also used for enlargement of the prostate gland in men, a condition called benign prostatic hyperplasia or BPH. This medicine improves urine flow and reduces BPH symptoms. This medicine can also treat both erection problems and BPH when they occur together. This medicine may be used for other purposes; ask your health care provider or pharmacist if you have questions. COMMON BRAND NAME(S): Adcirca, ALYQ, Cialis What should I tell my health care provider before I take this medicine? They need to know if you have any of these conditions: bleeding disorders eye or vision problems, including a rare inherited eye disease called retinitis pigmentosa anatomical deformation of the penis, Peyronie's disease, or history of priapism (painful and prolonged erection) heart disease, angina, a history of heart attack, irregular heart beats, or other heart problems high or low blood pressure history of blood diseases, like sickle cell anemia or leukemia history of stomach bleeding kidney disease liver disease stroke an unusual or allergic reaction to tadalafil, other medicines, foods, dyes, or preservatives pregnant or trying to get pregnant breast-feeding How should I use this medicine? Take this medicine by mouth with a glass of water. Follow the directions on the prescription label. You may take this medicine with or without meals. When this medicine is used for erection problems, your doctor may prescribe it to be taken once daily or as needed. If you are taking the medicine as needed, you may be able to have sexual activity 30 minutes after taking it and for up to 36 hours after taking it. Whether you are taking the medicine as needed or once daily, you should not take more than one dose per day. If you are taking this medicine for symptoms of benign prostatic hyperplasia (BPH) or to  treat both BPH and an erection problem, take the dose once daily at about the same time each day. Do not take your medicine more often than directed. Talk to your pediatrician regarding the use of this medicine in children. Special care may be needed. Overdosage: If you think you have taken too much of this medicine contact a poison control center or emergency room at once. NOTE: This medicine is only for you. Do not share this medicine with others. What if I miss a dose? If you are taking this medicine as needed for erection problems, this does not apply. If you miss a dose while taking this medicine once daily for an erection problem, benign prostatic hyperplasia, or both, take it as soon as you remember, but do not take more than one dose per day. What may interact with this medicine? Do not take this medicine with any of the following medications: nitrates like amyl nitrite, isosorbide dinitrate, isosorbide mononitrate, nitroglycerin other medicines for erectile dysfunction like avanafil, sildenafil, vardenafil other tadalafil products (Adcirca) riociguat This medicine may also interact with the following medications: certain drugs for high blood pressure certain drugs for the treatment of HIV infection or AIDS certain drugs used for fungal or yeast infections, like fluconazole, itraconazole, ketoconazole, and voriconazole certain drugs used for seizures like carbamazepine, phenytoin, and phenobarbital grapefruit juice macrolide antibiotics like clarithromycin, erythromycin, troleandomycin medicines for prostate problems rifabutin, rifampin or rifapentine This list may not describe all possible interactions. Give your health care provider a list of all the medicines, herbs, non-prescription drugs, or dietary supplements you use. Also tell them if you smoke, drink alcohol,   or use illegal drugs. Some items may interact with your medicine. What should I watch for while using this medicine? If  you notice any changes in your vision while taking this drug, call your doctor or health care professional as soon as possible. Stop using this medicine and call your health care provider right away if you have a loss of sight in one or both eyes. Contact your doctor or health care professional right away if the erection lasts longer than 4 hours or if it becomes painful. This may be a sign of serious problem and must be treated right away to prevent permanent damage. If you experience symptoms of nausea, dizziness, chest pain or arm pain upon initiation of sexual activity after taking this medicine, you should refrain from further activity and call your doctor or health care professional as soon as possible. Do not drink alcohol to excess (examples, 5 glasses of wine or 5 shots of whiskey) when taking this medicine. When taken in excess, alcohol can increase your chances of getting a headache or getting dizzy, increasing your heart rate or lowering your blood pressure. Using this medicine does not protect you or your partner against HIV infection (the virus that causes AIDS) or other sexually transmitted diseases. What side effects may I notice from receiving this medicine? Side effects that you should report to your doctor or health care professional as soon as possible: allergic reactions like skin rash, itching or hives, swelling of the face, lips, or tongue breathing problems changes in hearing changes in vision chest pain fast, irregular heartbeat prolonged or painful erection seizures Side effects that usually do not require medical attention (report to your doctor or health care professional if they continue or are bothersome): back pain dizziness flushing headache indigestion muscle aches nausea stuffy or runny nose This list may not describe all possible side effects. Call your doctor for medical advice about side effects. You may report side effects to FDA at 1-800-FDA-1088. Where  should I keep my medicine? Keep out of the reach of children. Store at room temperature between 15 and 30 degrees C (59 and 86 degrees F). Throw away any unused medicine after the expiration date. NOTE: This sheet is a summary. It may not cover all possible information. If you have questions about this medicine, talk to your doctor, pharmacist, or health care provider.  2021 Elsevier/Gold Standard (2013-08-29 13:15:49)  

## 2020-08-31 ENCOUNTER — Ambulatory Visit: Payer: Medicare Other | Admitting: Urology

## 2020-09-14 ENCOUNTER — Other Ambulatory Visit: Payer: Self-pay

## 2020-09-14 ENCOUNTER — Encounter: Payer: Self-pay | Admitting: Ophthalmology

## 2020-09-21 NOTE — Discharge Instructions (Signed)
INSTRUCTIONS FOLLOWING OCULOPLASTIC SURGERY AMY M. FOWLER, MD  AFTER YOUR EYE SURGERY, THER ARE MANY THINGS WHICH YOU, THE PATIENT, CAN DO TO ASSURE THE BEST POSSIBLE RESULT FROM YOUR OPERATION.  THIS SHEET SHOULD BE REFERRED TO WHENEVER QUESTIONS ARISE.  IF THERE ARE ANY QUESTIONS NOT ANSWERED HERE, DO NOT HESITATE TO CALL OUR OFFICE AT [phone] OR [phone].  THERE IS ALWAYS SOMEONE AVAILABLE TO CALL IF QUESTIONS OR PROBLEMS ARISE.  VISION: Your vision may be blurred and out of focus after surgery until you are able to stop using your ointment, swelling resolves and your eye(s) heal. This may take 1 to 2 weeks at the least.  If your vision becomes gradually more dim or dark, this is not normal and you need to call our office immediately.  EYE CARE: For the first 48 hours after surgery, use ice packs frequently - "20 minutes on, 20 minutes off" - to help reduce swelling and bruising.  Small bags of frozen peas or corn make good ice packs along with cloths soaked in ice water.  If you are wearing a patch or other type of dressing following surgery, keep this on for the amount of time specified by your doctor.  For the first week following surgery, you will need to treat your stitches with great care.  It is OK to shower, but take care to not allow soapy water to run into your eye(s) to help reduce chances of infection.  You may gently clean the eyelashes and around the eye(s) with cotton balls and sterile water, BUT DO NOT RUB THE STITCHES VIGOROUSLY.  Keeping your stitches moist with ointment will help promote healing with minimal scar formation.  ACTIVITY: When you leave the surgery center, you should go home, rest and be inactive.  The eye(s) may feel scratchy and keeping the eyes closed will allow for faster healing.  The first week following surgery, avoid straining (anything making the face turn red) or lifting over 20 pounds.  Additionally, avoid bending which causes your head to go below  your waist.  Using your eyes will NOT harm them, so feel free to read, watch television, use the computer, etc as desired.  Driving depends on each individual, so check with your doctor if you have questions about driving. Do not wear contact lenses for about 2 weeks.  Do not wear eye makeup for 2 weeks.  Avoid swimming, hot tubs, gardening, and dusting for 1 to 2 weeks to reduce the risk of an infection.  MEDICATIONS:  You will be given a prescription for an ointment to use 4 times a day on your stitches.  You can use the ointment in your eyes if they feel scratchy or irritated.  If you eyelid(s) don't close completely when you sleep, put some ointment in your eyes before bedtime.  EMERGENCY: If you experience SEVERE EYE PAIN OR HEADACHE UNRELIEVED BY TYLENOL OR TRAMADOL, NAUSEA OR VOMITING, WORSENING REDNESS, OR WORSENING VISION (ESPECIALLY VISION THAT WAS INITIALLY BETTER) CALL [phone] OR [phone] DURING BUSINESS HOURS OR AFTER HOURS. General Anesthesia, Adult, Care After This sheet gives you information about how to care for yourself after your procedure. Your health care provider may also give you more specific instructions. If you have problems or questions, contact your health care provider. What can I expect after the procedure? After the procedure, the following side effects are common:  Pain or discomfort at the IV site.  Nausea.  Vomiting.  Sore throat.  Trouble concentrating.  Feeling   cold or chills.  Feeling weak or tired.  Sleepiness and fatigue.  Soreness and body aches. These side effects can affect parts of the body that were not involved in surgery. Follow these instructions at home: For the time period you were told by your health care provider:  Rest.  Do not participate in activities where you could fall or become injured.  Do not drive or use machinery.  Do not drink alcohol.  Do not take sleeping pills or medicines that cause drowsiness.  Do  not make important decisions or sign legal documents.  Do not take care of children on your own.   Eating and drinking  Follow any instructions from your health care provider about eating or drinking restrictions.  When you feel hungry, start by eating small amounts of foods that are soft and easy to digest (bland), such as toast. Gradually return to your regular diet.  Drink enough fluid to keep your urine pale yellow.  If you vomit, rehydrate by drinking water, juice, or clear broth. General instructions  If you have sleep apnea, surgery and certain medicines can increase your risk for breathing problems. Follow instructions from your health care provider about wearing your sleep device: ? Anytime you are sleeping, including during daytime naps. ? While taking prescription pain medicines, sleeping medicines, or medicines that make you drowsy.  Have a responsible adult stay with you for the time you are told. It is important to have someone help care for you until you are awake and alert.  Return to your normal activities as told by your health care provider. Ask your health care provider what activities are safe for you.  Take over-the-counter and prescription medicines only as told by your health care provider.  If you smoke, do not smoke without supervision.  Keep all follow-up visits as told by your health care provider. This is important. Contact a health care provider if:  You have nausea or vomiting that does not get better with medicine.  You cannot eat or drink without vomiting.  You have pain that does not get better with medicine.  You are unable to pass urine.  You develop a skin rash.  You have a fever.  You have redness around your IV site that gets worse. Get help right away if:  You have difficulty breathing.  You have chest pain.  You have blood in your urine or stool, or you vomit blood. Summary  After the procedure, it is common to have a sore  throat or nausea. It is also common to feel tired.  Have a responsible adult stay with you for the time you are told. It is important to have someone help care for you until you are awake and alert.  When you feel hungry, start by eating small amounts of foods that are soft and easy to digest (bland), such as toast. Gradually return to your regular diet.  Drink enough fluid to keep your urine pale yellow.  Return to your normal activities as told by your health care provider. Ask your health care provider what activities are safe for you. This information is not intended to replace advice given to you by your health care provider. Make sure you discuss any questions you have with your health care provider. Document Revised: 12/25/2019 Document Reviewed: 07/24/2019 Elsevier Patient Education  2021 [employer].  

## 2020-09-28 ENCOUNTER — Encounter: Payer: Self-pay | Admitting: Ophthalmology

## 2020-10-06 ENCOUNTER — Other Ambulatory Visit: Payer: Self-pay

## 2020-10-06 ENCOUNTER — Other Ambulatory Visit
Admission: RE | Admit: 2020-10-06 | Discharge: 2020-10-06 | Disposition: A | Discharge status: Home / Self Care | Payer: Medicare Other | Source: Ambulatory Visit | Attending: Ophthalmology | Admitting: Ophthalmology

## 2020-10-06 DIAGNOSIS — Z20822 Contact with and (suspected) exposure to covid-19: Secondary | ICD-10-CM | POA: Insufficient documentation

## 2020-10-06 DIAGNOSIS — Z01812 Encounter for preprocedural laboratory examination: Secondary | ICD-10-CM | POA: Diagnosis present

## 2020-10-06 LAB — SARS CORONAVIRUS 2 (TAT 6-24 HRS): SARS Coronavirus 2: NEGATIVE

## 2020-10-08 ENCOUNTER — Ambulatory Visit: Payer: Medicare Other | Admitting: Anesthesiology

## 2020-10-08 ENCOUNTER — Ambulatory Visit
Admission: RE | Disposition: A | Discharge status: Home / Self Care | Payer: Self-pay | Source: Home / Self Care | Attending: Ophthalmology

## 2020-10-08 ENCOUNTER — Encounter: Payer: Self-pay | Admitting: Ophthalmology

## 2020-10-08 ENCOUNTER — Other Ambulatory Visit: Payer: Self-pay

## 2020-10-08 ENCOUNTER — Ambulatory Visit
Admission: RE | Admit: 2020-10-08 | Discharge: 2020-10-08 | Disposition: A | Discharge status: Home / Self Care | Payer: Medicare Other | Attending: Ophthalmology | Admitting: Ophthalmology

## 2020-10-08 DIAGNOSIS — Z79899 Other long term (current) drug therapy: Secondary | ICD-10-CM | POA: Insufficient documentation

## 2020-10-08 DIAGNOSIS — H02831 Dermatochalasis of right upper eyelid: Secondary | ICD-10-CM | POA: Insufficient documentation

## 2020-10-08 DIAGNOSIS — H02834 Dermatochalasis of left upper eyelid: Secondary | ICD-10-CM | POA: Diagnosis not present

## 2020-10-08 DIAGNOSIS — Z7982 Long term (current) use of aspirin: Secondary | ICD-10-CM | POA: Insufficient documentation

## 2020-10-08 HISTORY — DX: Other cerebral infarction due to occlusion or stenosis of small artery: I63.81

## 2020-10-08 HISTORY — DX: Essential (primary) hypertension: I10

## 2020-10-08 HISTORY — DX: Presence of dental prosthetic device (complete) (partial): Z97.2

## 2020-10-08 HISTORY — DX: Family history of other specified conditions: Z84.89

## 2020-10-08 HISTORY — DX: Unspecified osteoarthritis, unspecified site: M19.90

## 2020-10-08 HISTORY — PX: BROW LIFT: SHX178

## 2020-10-08 SURGERY — BLEPHAROPLASTY
Anesthesia: Monitor Anesthesia Care | Site: Eye | Laterality: Bilateral

## 2020-10-08 MED ORDER — ALFENTANIL 500 MCG/ML IJ INJ
INJECTION | INTRAVENOUS | Status: DC | PRN
  Administered 2020-10-08: 250 ug via INTRAVENOUS
  Administered 2020-10-08: 500 ug via INTRAVENOUS
  Administered 2020-10-08: 250 ug via INTRAVENOUS

## 2020-10-08 MED ORDER — OXYCODONE HCL 5 MG/5ML PO SOLN: 5.0000 mg | Freq: Once | ORAL | Status: DC | PRN

## 2020-10-08 MED ORDER — LIDOCAINE-EPINEPHRINE 2 %-1:100000 IJ SOLN
INTRAMUSCULAR | Status: DC | PRN
  Administered 2020-10-08: 2 mL via OPHTHALMIC

## 2020-10-08 MED ORDER — ERYTHROMYCIN 5 MG/GM OP OINT
TOPICAL_OINTMENT | OPHTHALMIC | Status: DC | PRN
  Administered 2020-10-08: 1 via OPHTHALMIC

## 2020-10-08 MED ORDER — ONDANSETRON HCL 4 MG/2ML IJ SOLN
INTRAMUSCULAR | Status: DC | PRN
  Administered 2020-10-08: 4 mg via INTRAVENOUS

## 2020-10-08 MED ORDER — LACTATED RINGERS IV SOLN: INTRAVENOUS | Status: DC

## 2020-10-08 MED ORDER — BSS IO SOLN
INTRAOCULAR | Status: DC | PRN
  Administered 2020-10-08: 15 mL via INTRAOCULAR

## 2020-10-08 MED ORDER — ACETAMINOPHEN 325 MG PO TABS: 325.0000 mg | ORAL_TABLET | Freq: Once | ORAL | Status: DC

## 2020-10-08 MED ORDER — OXYCODONE HCL 5 MG PO TABS: 5.0000 mg | ORAL_TABLET | Freq: Once | ORAL | Status: DC | PRN

## 2020-10-08 MED ORDER — ERYTHROMYCIN 5 MG/GM OP OINT: TOPICAL_OINTMENT | OPHTHALMIC | 2 refills | Status: DC

## 2020-10-08 MED ORDER — DEXMEDETOMIDINE HCL 200 MCG/2ML IV SOLN
INTRAVENOUS | Status: DC | PRN
  Administered 2020-10-08: 10 ug via INTRAVENOUS

## 2020-10-08 MED ORDER — MIDAZOLAM HCL 2 MG/2ML IJ SOLN
INTRAMUSCULAR | Status: DC | PRN
  Administered 2020-10-08: 2 mg via INTRAVENOUS

## 2020-10-08 MED ORDER — ACETAMINOPHEN 160 MG/5ML PO SOLN: 325.0000 mg | Freq: Once | ORAL | Status: DC

## 2020-10-08 MED ORDER — TRAMADOL HCL 50 MG PO TABS: ORAL_TABLET | ORAL | 0 refills | Status: DC

## 2020-10-08 MED ORDER — TETRACAINE HCL 0.5 % OP SOLN
OPHTHALMIC | Status: DC | PRN
  Administered 2020-10-08: 1 [drp] via OPHTHALMIC

## 2020-10-08 SURGICAL SUPPLY — 36 items
APPLICATOR COTTON TIP WD 3 STR (MISCELLANEOUS) ×3 IMPLANT
BLADE SURG 15 STRL LF DISP TIS (BLADE) ×1 IMPLANT
BLADE SURG 15 STRL SS (BLADE) ×3
CORD BIP STRL DISP 12FT (MISCELLANEOUS) ×3 IMPLANT
GAUZE SPONGE 4X4 12PLY STRL (GAUZE/BANDAGES/DRESSINGS) ×3 IMPLANT
GLOVE SURG LX 7.0 MICRO (GLOVE) ×4
GLOVE SURG LX STRL 7.0 MICRO (GLOVE) ×2 IMPLANT
GOWN STRL REUS W/ TWL LRG LVL3 (GOWN DISPOSABLE) ×1 IMPLANT
GOWN STRL REUS W/TWL LRG LVL3 (GOWN DISPOSABLE) ×3
MARKER SKIN XFINE TIP W/RULER (MISCELLANEOUS) ×3 IMPLANT
NEEDLE FILTER BLUNT 18X 1/2SAF (NEEDLE) ×2
NEEDLE FILTER BLUNT 18X1 1/2 (NEEDLE) ×1 IMPLANT
NEEDLE HYPO 30X.5 LL (NEEDLE) ×6 IMPLANT
PACK ENT CUSTOM (PACKS) ×3 IMPLANT
SOL PREP PVP 2OZ (MISCELLANEOUS) ×3
SOLUTION PREP PVP 2OZ (MISCELLANEOUS) ×1 IMPLANT
SPONGE GAUZE 2X2 8PLY STER LF (GAUZE/BANDAGES/DRESSINGS) ×10
SPONGE GAUZE 2X2 8PLY STRL LF (GAUZE/BANDAGES/DRESSINGS) ×20 IMPLANT
SUT CHROMIC 4-0 (SUTURE)
SUT CHROMIC 4-0 M2 12X2 ARM (SUTURE)
SUT CHROMIC 5 0 P 3 (SUTURE) IMPLANT
SUT ETHILON 4 0 CL P 3 (SUTURE) IMPLANT
SUT GUT PLAIN 6-0 1X18 ABS (SUTURE) ×3 IMPLANT
SUT MERSILENE 4-0 S-2 (SUTURE) IMPLANT
SUT PROLENE 5 0 P 3 (SUTURE) IMPLANT
SUT PROLENE 6 0 P 1 18 (SUTURE) IMPLANT
SUT SILK 4 0 G 3 (SUTURE) IMPLANT
SUT VIC AB 5-0 P-3 18X BRD (SUTURE) IMPLANT
SUT VIC AB 5-0 P3 18 (SUTURE)
SUT VICRYL 6-0  S14 CTD (SUTURE)
SUT VICRYL 6-0 S14 CTD (SUTURE) IMPLANT
SUT VICRYL 7 0 TG140 8 (SUTURE) IMPLANT
SUTURE CHRMC 4-0 M2 12X2 ARM (SUTURE) IMPLANT
SYR 10ML LL (SYRINGE) ×3 IMPLANT
SYR 3ML LL SCALE MARK (SYRINGE) ×3 IMPLANT
WATER STERILE IRR 250ML POUR (IV SOLUTION) ×3 IMPLANT

## 2020-10-08 NOTE — Anesthesia Postprocedure Evaluation (Signed)
Anesthesia Post Note  Patient: Gary Esparza  Procedure(s) Performed: BLEPHAROPLASTY UPPER EYELID; W/EXCESS SKIN BILATERAL (Bilateral: Eye)     Patient location during evaluation: PACU Anesthesia Type: MAC Level of consciousness: awake and alert and oriented Pain management: satisfactory to patient Vital Signs Assessment: post-procedure vital signs reviewed and stable Respiratory status: spontaneous breathing, nonlabored ventilation and respiratory function stable Cardiovascular status: blood pressure returned to baseline and stable Postop Assessment: Adequate PO intake and No signs of nausea or vomiting Anesthetic complications: no   No notable events documented.  Raliegh Ip

## 2020-10-08 NOTE — Anesthesia Procedure Notes (Signed)
Procedure Name: MAC Date/Time: 10/08/2020 10:06 AM Performed by: Jeannene Patella, CRNA Pre-anesthesia Checklist: Patient identified, Emergency Drugs available, Suction available, Patient being monitored and Timeout performed Patient Re-evaluated:Patient Re-evaluated prior to induction Oxygen Delivery Method: Nasal cannula

## 2020-10-08 NOTE — Anesthesia Preprocedure Evaluation (Signed)
Anesthesia Evaluation  Patient identified by MRN, date of birth, ID band Patient awake    Reviewed: Allergy & Precautions, H&P , NPO status , Patient's Chart, lab work & pertinent test results  Airway Mallampati: II  TM Distance: >3 FB Neck ROM: full    Dental no notable dental hx.    Pulmonary    Pulmonary exam normal breath sounds clear to auscultation       Cardiovascular hypertension, Normal cardiovascular exam Rhythm:regular Rate:Normal     Neuro/Psych    GI/Hepatic GERD  ,  Endo/Other    Renal/GU      Musculoskeletal   Abdominal   Peds  Hematology   Anesthesia Other Findings   Reproductive/Obstetrics                             Anesthesia Physical Anesthesia Plan  ASA: 2  Anesthesia Plan: MAC   Post-op Pain Management:    Induction:   PONV Risk Score and Plan: 1 and Treatment may vary due to age or medical condition, Ondansetron and TIVA  Airway Management Planned:   Additional Equipment:   Intra-op Plan:   Post-operative Plan:   Informed Consent: I have reviewed the patients History and Physical, chart, labs and discussed the procedure including the risks, benefits and alternatives for the proposed anesthesia with the patient or authorized representative who has indicated his/her understanding and acceptance.     Dental Advisory Given  Plan Discussed with: CRNA  Anesthesia Plan Comments:         Anesthesia Quick Evaluation

## 2020-10-08 NOTE — Interval H&P Note (Signed)
History and Physical Interval Note:  10/08/2020 9:49 AM  Gary Esparza  has presented today for surgery, with the diagnosis of H02.831 Dermatochalasis of Right Upper Eyelid H02.834 Dermatochalasis of Left Upper Eyelid.  The various methods of treatment have been discussed with the patient and family. After consideration of risks, benefits and other options for treatment, the patient has consented to  Procedure(s): BLEPHAROPLASTY UPPER EYELID; W/EXCESS SKIN BILATERAL (Bilateral) as a surgical intervention.  The patient's history has been reviewed, patient examined, no change in status, stable for surgery.  I have reviewed the patient's chart and labs.  Questions were answered to the patient's satisfaction.     Ether Griffins, Kahlin Mark M

## 2020-10-08 NOTE — Op Note (Signed)
Preoperative Diagnosis:  Visually significant dermatochalasis bilateral  Upper Eyelid(s)  Postoperative Diagnosis:  Same.  Procedure(s) Performed:   Upper eyelid blepharoplasty with excess skin excision  bilateral  Upper Eyelid(s)  Surgeon: Philis Pique. Vickki Muff, M.D.  Assistants: none  Anesthesia: MAC  Specimens: None.  Estimated Blood Loss: Minimal.  Complications: None.  Operative Findings: None Dictated  Procedure:   Allergies were reviewed and the patient is allergic to Sulfa antibiotics.   After the risks, benefits, complications and alternatives were discussed with the patient, appropriate informed consent was obtained and the patient was brought to the operating suite. The patient was reclined supine and a timeout was conducted.  The patient was then sedated.  Local anesthetic consisting of a 50-50 mixture of 2% lidocaine with epinephrine and 0.75% bupivacaine with added Hylenex was injected subcutaneously to both  upper eyelid(s). After adequate local was instilled, the patient was prepped and draped in the usual sterile fashion for eyelid surgery.   Attention was turned to the upper eyelids. A 27m upper eyelid crease incision line was marked with calipers on both  upper eyelid(s).  A pinch test was used to estimate the amount of excess skin to remove and this was marked in standard blepharoplasty style fashion. Attention was turned to the  right  upper eyelid. A #15 blade was used to open the premarked incision line. A Skin only flap was excised and hemostasis was obtained with bipolar cautery.   Attention was then turned to the opposite eyelid where the same procedure was performed in the same manner. Hemostasis was obtained with bipolar cautery throughout. All incisions were then closed with a combination of running and interrupted 6-0 fast absorbing plain suture. The patient tolerated the procedure well.  Erythromycin ophthalmic ointment was applied to his incision sites,  followed by ice packs. He was taken to the recovery area where he recovered without difficulty.  Post-Op Plan/Instructions:  The patient was instructed to use ice packs frequently for the next 48 hours. He was instructed to use Erythromycin ophthalmic ointment on his incisions 4 times a day for the next 12 to 14 days. He was given a prescription for tramadol (or similar) for pain control should Tylenol not be effective. He was asked to [facility] needed [place] problems.  Gary Esparza [name] M.D. Ophthalmology

## 2020-10-08 NOTE — H&P (Signed)
[facility]: [facility]  Primary Care Physician:  Marina Goodell, MD Ophthalmologist: Dr. Hubbard Robinson. Ether Griffins, M.D.  Pre-Procedure History & Physical: HPI:  Gary Esparza is a 75 y.o. male here for periocular surgery.   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  Found on MRI.  No deficits   Wears dentures    partial upper and lower    Past Surgical History:  Procedure Laterality Date   COLONOSCOPY WITH PROPOFOL N/A 12/01/2019   Procedure: COLONOSCOPY WITH PROPOFOL;  Surgeon: Toney Reil, MD;  Location: [facility] ENDOSCOPY;  Service: Gastroenterology;  Laterality: N/A;   ESOPHAGOGASTRODUODENOSCOPY (EGD) WITH PROPOFOL N/A 12/01/2019   Procedure: ESOPHAGOGASTRODUODENOSCOPY (EGD) WITH PROPOFOL;  Surgeon: Toney Reil, MD;  Location: [facility] ENDOSCOPY;  Service: Gastroenterology;  Laterality: N/A;    Prior to Admission medications   Medication Sig Start Date End Date Taking? Authorizing Provider  aspirin 81 MG EC tablet Take 1 tablet by mouth daily.   Yes [provider]  cetirizine (ZYRTEC) 10 MG tablet Take 10 mg by mouth daily.   Yes [provider]  Cholecalciferol (VITAMIN D-3) 125 MCG (5000 UT) TABS Take by mouth.   Yes [provider]  Corn Dextrin ([employer] EASY FIBER) POWD Take by mouth.   Yes [provider]  finasteride (PROSCAR) 5 MG tablet Take 1 tablet (5 mg total) by mouth daily. 12/02/19  Yes Sondra Come, MD  losartan (COZAAR) 25 MG tablet Take by mouth.   Yes [provider]  Multiple Vitamins-Minerals (VITRUM SENIOR) TABS Take by mouth.   Yes [provider]  Probiotic Product (PROBIOTIC-10 PO) Take by mouth.   Yes [provider]  rosuvastatin (CRESTOR) 20 MG tablet Take 1 tablet by mouth daily. 04/14/20  Yes [provider]  tadalafil (CIALIS) 5 MG tablet Take 1 tablet (5 mg total) by mouth daily as needed for erectile dysfunction. 08/24/20  Yes Sondra Come, MD  fexofenadine (ALLEGRA) 180 MG tablet Take 180 mg by mouth daily. Patient not taking: Reported on 09/28/2020    [provider]    Allergies as of 08/11/2020 - Review Complete 12/19/2019  Allergen Reaction Noted   Other Rash 08/27/2019    History reviewed. No pertinent family history.  Social History   Socioeconomic History   Marital status: Widowed    Spouse name: Not on file   Number of children: Not on file   Years of education: Not on file   Highest education level: Not on file  Occupational History   Not on file  Tobacco Use   Smoking status: Never   Smokeless tobacco: Never  Vaping Use   Vaping Use: Never used  Substance and Sexual Activity   Alcohol use: Yes    Comment: 2 a month    Drug use: Never   Sexual activity: Not on file  Other Topics Concern   Not on file  Social History Narrative   Not on file   Social Determinants of Health   Financial Resource Strain: Not on file  Food Insecurity: Not on file  Transportation Needs: Not on file  Physical Activity: Not on file  Stress: Not on file  Social Connections: Not on file  Intimate Partner Violence: Not on file  Review of Systems: See HPI, otherwise negative ROS  Physical Exam: BP (!) 148/56   Pulse 89   Temp (!) 97.5 F (36.4 C) (Temporal)   Resp 18   Ht 5\' 8"  (1.727 m)   Wt 89.4 kg   SpO2 99%   BMI 29.95 kg/m  General:   Alert and cooperative in NAD Head:  Normocephalic and atraumatic. Respiratory:  Normal work of breathing.  Impression/Plan: Gary Esparza is here for periocular surgery.  Risks, benefits, limitations, and alternatives regarding surgery have been reviewed with the patient.  Questions have been answered.  All parties agreeable.   Donnie Mesa, MD  10/08/2020, 9:49 AM

## 2020-10-08 NOTE — Transfer of Care (Signed)
Immediate Anesthesia Transfer of Care Note  Patient: Gary Esparza  Procedure(s) Performed: BLEPHAROPLASTY UPPER EYELID; W/EXCESS SKIN BILATERAL (Bilateral: Eye)  Patient Location: PACU  Anesthesia Type: MAC  Level of Consciousness: awake, alert  and patient cooperative  Airway and Oxygen Therapy: Patient Spontanous Breathing and Patient connected to supplemental oxygen  Post-op Assessment: Post-op Vital signs reviewed, Patient's Cardiovascular Status Stable, Respiratory Function Stable, Patent Airway and No signs of Nausea or vomiting  Post-op Vital Signs: Reviewed and stable  Complications: No notable events documented.

## 2020-11-11 ENCOUNTER — Other Ambulatory Visit: Payer: Self-pay | Admitting: Urology

## 2021-03-02 ENCOUNTER — Encounter: Payer: Self-pay | Admitting: Ophthalmology

## 2021-03-10 NOTE — Discharge Instructions (Signed)

## 2021-03-14 ENCOUNTER — Encounter
Admission: RE | Disposition: A | Discharge status: Home / Self Care | Payer: Self-pay | Source: Home / Self Care | Attending: Ophthalmology

## 2021-03-14 ENCOUNTER — Ambulatory Visit
Admission: RE | Admit: 2021-03-14 | Discharge: 2021-03-14 | Disposition: A | Discharge status: Home / Self Care | Payer: Medicare Other | Attending: Ophthalmology | Admitting: Ophthalmology

## 2021-03-14 ENCOUNTER — Ambulatory Visit: Payer: Medicare Other | Admitting: Anesthesiology

## 2021-03-14 ENCOUNTER — Encounter: Payer: Self-pay | Admitting: Ophthalmology

## 2021-03-14 ENCOUNTER — Other Ambulatory Visit: Payer: Self-pay

## 2021-03-14 DIAGNOSIS — K219 Gastro-esophageal reflux disease without esophagitis: Secondary | ICD-10-CM | POA: Insufficient documentation

## 2021-03-14 DIAGNOSIS — H2512 Age-related nuclear cataract, left eye: Secondary | ICD-10-CM | POA: Insufficient documentation

## 2021-03-14 DIAGNOSIS — Z79899 Other long term (current) drug therapy: Secondary | ICD-10-CM | POA: Diagnosis not present

## 2021-03-14 DIAGNOSIS — I10 Essential (primary) hypertension: Secondary | ICD-10-CM | POA: Insufficient documentation

## 2021-03-14 HISTORY — PX: CATARACT EXTRACTION W/PHACO: SHX586

## 2021-03-14 SURGERY — PHACOEMULSIFICATION, CATARACT, WITH IOL INSERTION
Anesthesia: Monitor Anesthesia Care | Site: Eye | Laterality: Left

## 2021-03-14 MED ORDER — ARMC OPHTHALMIC DILATING DROPS
1.0000 "application " | OPHTHALMIC | Status: DC | PRN
  Administered 2021-03-14 (×3): 1 via OPHTHALMIC

## 2021-03-14 MED ORDER — TETRACAINE HCL 0.5 % OP SOLN
1.0000 [drp] | OPHTHALMIC | Status: DC | PRN
  Administered 2021-03-14 (×3): 1 [drp] via OPHTHALMIC

## 2021-03-14 MED ORDER — SIGHTPATH DOSE#1 BSS IO SOLN
INTRAOCULAR | Status: DC | PRN
  Administered 2021-03-14: 15 mL via INTRAOCULAR

## 2021-03-14 MED ORDER — ONDANSETRON HCL 4 MG/2ML IJ SOLN: 4.0000 mg | Freq: Once | INTRAMUSCULAR | Status: DC | PRN

## 2021-03-14 MED ORDER — FENTANYL CITRATE (PF) 100 MCG/2ML IJ SOLN
INTRAMUSCULAR | Status: DC | PRN
  Administered 2021-03-14 (×2): 50 ug via INTRAVENOUS

## 2021-03-14 MED ORDER — LIDOCAINE HCL (PF) 2 % IJ SOLN
INTRAOCULAR | Status: DC | PRN
  Administered 2021-03-14: 4 mL via INTRAOCULAR

## 2021-03-14 MED ORDER — CEFUROXIME OPHTHALMIC INJECTION 1 MG/0.1 ML
INJECTION | OPHTHALMIC | Status: DC | PRN
  Administered 2021-03-14: 0.1 mL via INTRACAMERAL

## 2021-03-14 MED ORDER — MIDAZOLAM HCL 2 MG/2ML IJ SOLN
INTRAMUSCULAR | Status: DC | PRN
  Administered 2021-03-14 (×2): 1 mg via INTRAVENOUS

## 2021-03-14 MED ORDER — SIGHTPATH DOSE#1 BSS IO SOLN
INTRAOCULAR | Status: DC | PRN
  Administered 2021-03-14: 77 mL via OPHTHALMIC

## 2021-03-14 MED ORDER — ACETAMINOPHEN 160 MG/5ML PO SOLN: 325.0000 mg | ORAL | Status: DC | PRN

## 2021-03-14 MED ORDER — SIGHTPATH DOSE#1 SODIUM HYALURONATE 10 MG/ML IO SOLUTION
PREFILLED_SYRINGE | INTRAOCULAR | Status: DC | PRN
  Administered 2021-03-14: 0.85 mL via INTRAOCULAR

## 2021-03-14 MED ORDER — SIGHTPATH DOSE#1 SODIUM HYALURONATE 23 MG/ML IO SOLUTION
PREFILLED_SYRINGE | INTRAOCULAR | Status: DC | PRN
  Administered 2021-03-14: 0.6 mL via INTRAOCULAR

## 2021-03-14 MED ORDER — LACTATED RINGERS IV SOLN: INTRAVENOUS | Status: DC

## 2021-03-14 MED ORDER — ACETAMINOPHEN 325 MG PO TABS: 325.0000 mg | ORAL_TABLET | ORAL | Status: DC | PRN

## 2021-03-14 SURGICAL SUPPLY — 15 items
CANNULA ANT/CHMB 27GA (MISCELLANEOUS) IMPLANT
DISSECTOR HYDRO NUCLEUS 50X22 (MISCELLANEOUS) ×2 IMPLANT
GLOVE SURG GAMMEX PI TX LF 7.5 (GLOVE) ×2 IMPLANT
GLOVE SURG SYN 8.5  E (GLOVE) ×1
GLOVE SURG SYN 8.5 E (GLOVE) ×1 IMPLANT
GOWN STRL REUS W/ TWL LRG LVL3 (GOWN DISPOSABLE) ×2 IMPLANT
GOWN STRL REUS W/TWL LRG LVL3 (GOWN DISPOSABLE) ×4
LENS IOL ACRSF IQ PAN 22.0 ×1 IMPLANT
LENS IOL IQ PANOPTIX 22.0 ×2 IMPLANT
MARKER SKIN DUAL TIP RULER LAB (MISCELLANEOUS) IMPLANT
PACK EYE AFTER SURG (MISCELLANEOUS) ×2 IMPLANT
SYR 3ML LL SCALE MARK (SYRINGE) ×2 IMPLANT
SYR TB 1ML LUER SLIP (SYRINGE) ×2 IMPLANT
WATER STERILE IRR 250ML POUR (IV SOLUTION) ×2 IMPLANT
WIPE NON LINTING 3.25X3.25 (MISCELLANEOUS) ×2 IMPLANT

## 2021-03-14 NOTE — H&P (Signed)
[facility]   Primary Care Physician:  Sofie Hartigan, MD Ophthalmologist: Dr. Benay Pillow  Pre-Procedure History & Physical: HPI:  Gary Esparza is a 75 y.o. male here for cataract surgery.   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  Found on MRI.  No deficits   Wears dentures    partial upper and lower    Past Surgical History:  Procedure Laterality Date   BROW LIFT Bilateral 10/08/2020   Procedure: BLEPHAROPLASTY UPPER EYELID; W/EXCESS SKIN BILATERAL;  Surgeon: Karle Starch, MD;  Location: [place];  Service: Ophthalmology;  Laterality: Bilateral;   COLONOSCOPY WITH PROPOFOL N/A 12/01/2019   Procedure: COLONOSCOPY WITH PROPOFOL;  Surgeon: Lin Landsman, MD;  Location: [facility] ENDOSCOPY;  Service: Gastroenterology;  Laterality: N/A;   ESOPHAGOGASTRODUODENOSCOPY (EGD) WITH PROPOFOL N/A 12/01/2019   Procedure: ESOPHAGOGASTRODUODENOSCOPY (EGD) WITH PROPOFOL;  Surgeon: Lin Landsman, MD;  Location[facility] ENDOSCOPY;  Service: Gastroenterology;  Laterality: N/A;    Prior to Admission medications   Medication Sig Start Date End Date Taking? Authorizing Provider  aspirin 81 MG EC tablet Take 1 tablet by mouth daily.   Yes [provider]  cetirizine (ZYRTEC) 10 MG tablet Take 10 mg by mouth daily.   Yes [provider]  Cholecalciferol (VITAMIN D-3) 125 MCG (5000 UT) TABS Take by mouth.   Yes [provider]  Corn Dextrin ([employer] EASY FIBER) POWD Take by mouth.   Yes [provider]  finasteride (PROSCAR) 5 MG tablet TAKE 1 TABLET DAILY 11/11/20  Yes Billey Co, MD  losartan (COZAAR) 25 MG tablet Take by mouth.   Yes [provider]  MELATONIN PO Take by mouth as needed.   Yes [provider]  Multiple Vitamins-Minerals (VITRUM SENIOR) TABS  Take by mouth.   Yes [provider]  Probiotic Product (PROBIOTIC-10 PO) Take by mouth.   Yes [provider]  rosuvastatin (CRESTOR) 20 MG tablet Take 1 tablet by mouth daily. 04/14/20  Yes [provider]  tadalafil (CIALIS) 5 MG tablet Take 1 tablet (5 mg total) by mouth daily as needed for erectile dysfunction. 08/24/20  Yes Billey Co, MD  erythromycin ophthalmic ointment Apply to sutures 4 times a day for 10-12 days.  Discontinue if allergy develops and call our office Patient not taking: Reported on 03/02/2021 10/08/20   Karle Starch, MD  traMADol Veatrice Bourbon) 50 MG tablet Take 1 every 4-6 hours as needed for pain not controlled by Tylenol Patient not taking: Reported on 03/02/2021 10/08/20   Karle Starch, MD    Allergies as of 02/03/2021 - Review Complete 10/08/2020  Allergen Reaction Noted   Sulfa antibiotics Rash 09/14/2020    History reviewed. No pertinent family history.  Social History   Socioeconomic History   Marital status: Widowed    Spouse name: Not on file   Number of children: Not on file   Years of education: Not on file   Highest education level: Not on file  Occupational History   Not on file  Tobacco Use   Smoking status: Never   Smokeless tobacco: Never  Vaping Use   Vaping Use: Never used  Substance and Sexual Activity   Alcohol use: Yes    Comment: 2 a month  Drug use: Never   Sexual activity: Not on file  Other Topics Concern   Not on file  Social History Narrative   Not on file   Social Determinants of Health   Financial Resource Strain: Not on file  Food Insecurity: Not on file  Transportation Needs: Not on file  Physical Activity: Not on file  Stress: Not on file  Social Connections: Not on file  Intimate Partner Violence: Not on file    Review of Systems: See HPI, otherwise negative ROS  Physical Exam: BP 135/82   Pulse 71   Temp 97.8 F (36.6 C) (Temporal)   Ht 5\' 8"  (1.727 m)   Wt 92.1 kg    SpO2 98%   BMI 30.87 kg/m  General:   Alert, cooperative in NAD Head:  Normocephalic and atraumatic. Respiratory:  Normal work of breathing. Cardiovascular:  RRR  Impression/Plan: Gary Esparza is here for cataract surgery.  Risks, benefits, limitations, and alternatives regarding cataract surgery have been reviewed with the patient.  Questions have been answered.  All parties agreeable.   Gary Mesa, MD  03/14/2021, 10:21 AM

## 2021-03-14 NOTE — Anesthesia Postprocedure Evaluation (Signed)
Anesthesia Post Note  Patient: Gary Esparza  Procedure(s) Performed: CATARACT EXTRACTION PHACO AND INTRAOCULAR LENS PLACEMENT (IOC) LEFT PANOPTIX  2.12 00:21.2 (Left: Eye)     Patient location during evaluation: PACU Anesthesia Type: MAC Level of consciousness: awake and alert Pain management: pain level controlled Vital Signs Assessment: post-procedure vital signs reviewed and stable Respiratory status: spontaneous breathing, nonlabored ventilation, respiratory function stable and patient connected to nasal cannula oxygen Cardiovascular status: stable and blood pressure returned to baseline Postop Assessment: no apparent nausea or vomiting Anesthetic complications: no   No notable events documented.  Sinda Du

## 2021-03-14 NOTE — Transfer of Care (Signed)
Immediate Anesthesia Transfer of Care Note  Patient: Gary Esparza  Procedure(s) Performed: CATARACT EXTRACTION PHACO AND INTRAOCULAR LENS PLACEMENT (IOC) LEFT PANOPTIX  2.12 00:21.2 (Left: Eye)  Patient Location: PACU  Anesthesia Type: MAC  Level of Consciousness: awake, alert  and patient cooperative  Airway and Oxygen Therapy: Patient Spontanous Breathing and Patient connected to supplemental oxygen  Post-op Assessment: Post-op Vital signs reviewed, Patient's Cardiovascular Status Stable, Respiratory Function Stable, Patent Airway and No signs of Nausea or vomiting  Post-op Vital Signs: Reviewed and stable  Complications: No notable events documented.

## 2021-03-14 NOTE — Op Note (Signed)
OPERATIVE NOTE  Gary Esparza [id number] 03/14/2021   PREOPERATIVE DIAGNOSIS:  Nuclear sclerotic cataract left eye.  H25.12   POSTOPERATIVE DIAGNOSIS:    Nuclear sclerotic cataract left eye.     PROCEDURE:  Phacoemusification with posterior chamber intraocular lens placement of the left eye   LENS:   Implant Name Type Inv. Item Serial No. Manufacturer Lot No. LRB No. Used Action  LENS IOL IQ PANOPTIX 22.0 - [id number]  LENS IOL IQ PANOPTIX 22.0 [id number] [employer]  Left 1 Implanted      Procedure(s): CATARACT EXTRACTION PHACO AND INTRAOCULAR LENS PLACEMENT (IOC) LEFT PANOPTIX  2.12 00:21.2 (Left)  [id number]   SURGEON:  Willey Blade, MD, MPH   ANESTHESIA:  Topical with tetracaine drops augmented with 1% preservative-free intracameral lidocaine.  ESTIMATED BLOOD LOSS: <1 mL   COMPLICATIONS:  None.   DESCRIPTION OF PROCEDURE:  The patient was identified in the holding room and transported to the operating room and placed in the supine position under the operating microscope.  The left eye was identified as the operative eye and it was prepped and draped in the usual sterile ophthalmic fashion.   A 1.0 millimeter clear-corneal paracentesis was made at the 5:00 position. 0.5 ml of preservative-free 1% lidocaine with epinephrine was injected into the anterior chamber.  The anterior chamber was filled with Healon 5 viscoelastic.  A 2.4 millimeter keratome was used to make a near-clear corneal incision at the 2:00 position.  A curvilinear capsulorrhexis was made with a cystotome and capsulorrhexis forceps.  Balanced salt solution was used to hydrodissect and hydrodelineate the nucleus.   Phacoemulsification was then used in stop and chop fashion to remove the lens nucleus and epinucleus.  The remaining cortex was then removed using the irrigation and aspiration handpiece. Healon was then placed into the capsular bag to distend it for lens placement.  A lens was then injected into the  capsular bag.  The remaining viscoelastic was aspirated.   Wounds were hydrated with balanced salt solution.  The anterior chamber was inflated to a physiologic pressure with balanced salt solution.  Intracameral cefuroxime 0.1 mL at 10 mg/mL was injected into the eye.  No wound leaks were noted.  The patient was taken to the recovery room in stable condition without complications of anesthesia or surgery  Willey Blade 03/14/2021, 10:49 AM

## 2021-03-14 NOTE — Anesthesia Preprocedure Evaluation (Signed)
Anesthesia Evaluation  Patient identified by MRN, date of birth, ID band Patient awake    Reviewed: Allergy & Precautions, H&P , NPO status , Patient's Chart, lab work & pertinent test results  History of Anesthesia Complications (+) Family history of anesthesia reaction  Airway Mallampati: II  TM Distance: >3 FB Neck ROM: full    Dental no notable dental hx.    Pulmonary neg pulmonary ROS,    Pulmonary exam normal breath sounds clear to auscultation       Cardiovascular Exercise Tolerance: Good hypertension, Normal cardiovascular exam Rhythm:regular Rate:Normal     Neuro/Psych negative neurological ROS  negative psych ROS   GI/Hepatic Neg liver ROS, GERD  Medicated and Controlled,  Endo/Other    Renal/GU negative Renal ROS  negative genitourinary   Musculoskeletal  (+) Arthritis ,   Abdominal (+) + obese,   Peds  Hematology negative hematology ROS (+)   Anesthesia Other Findings   Reproductive/Obstetrics                             Anesthesia Physical  Anesthesia Plan  ASA: 2  Anesthesia Plan: MAC   Post-op Pain Management:    Induction:   PONV Risk Score and Plan: 1 and Treatment may vary due to age or medical condition, Ondansetron and TIVA  Airway Management Planned: Nasal Cannula and Natural Airway  Additional Equipment:   Intra-op Plan:   Post-operative Plan:   Informed Consent: I have reviewed the patients History and Physical, chart, labs and discussed the procedure including the risks, benefits and alternatives for the proposed anesthesia with the patient or authorized representative who has indicated his/her understanding and acceptance.     Dental Advisory Given  Plan Discussed with: CRNA  Anesthesia Plan Comments:         Anesthesia Quick Evaluation  Patient Active Problem List   Diagnosis Date Noted  . Gastroesophageal reflux disease   .  Altered bowel habits    No flowsheet data found. BMP Latest Ref Rng & Units 11/28/2019  Creatinine 0.61 - 1.24 mg/dL 0.99    Risks and benefits of anesthesia discussed at length, patient or surrogate demonstrates understanding. Appropriately NPO. Plan to proceed with anesthesia.  Champ Mungo, MD 03/14/21

## 2021-03-14 NOTE — Anesthesia Procedure Notes (Signed)
Procedure Name: MAC Date/Time: 03/14/2021 10:34 AM Performed by: Cameron Ali, CRNA Pre-anesthesia Checklist: Patient identified, Emergency Drugs available, Suction available, Timeout performed and Patient being monitored Patient Re-evaluated:Patient Re-evaluated prior to induction Oxygen Delivery Method: Nasal cannula Placement Confirmation: positive ETCO2

## 2021-03-15 ENCOUNTER — Encounter: Payer: Self-pay | Admitting: Ophthalmology

## 2021-03-16 ENCOUNTER — Encounter: Payer: Self-pay | Admitting: Ophthalmology

## 2021-03-24 NOTE — Discharge Instructions (Signed)

## 2021-03-28 ENCOUNTER — Ambulatory Visit
Admission: RE | Admit: 2021-03-28 | Discharge: 2021-03-28 | Disposition: A | Discharge status: Home / Self Care | Payer: Medicare Other | Attending: Ophthalmology | Admitting: Ophthalmology

## 2021-03-28 ENCOUNTER — Encounter: Payer: Self-pay | Admitting: Ophthalmology

## 2021-03-28 ENCOUNTER — Encounter
Admission: RE | Disposition: A | Discharge status: Home / Self Care | Payer: Self-pay | Source: Home / Self Care | Attending: Ophthalmology

## 2021-03-28 ENCOUNTER — Ambulatory Visit: Payer: Medicare Other | Admitting: Anesthesiology

## 2021-03-28 ENCOUNTER — Other Ambulatory Visit: Payer: Self-pay

## 2021-03-28 DIAGNOSIS — I10 Essential (primary) hypertension: Secondary | ICD-10-CM | POA: Diagnosis not present

## 2021-03-28 DIAGNOSIS — K219 Gastro-esophageal reflux disease without esophagitis: Secondary | ICD-10-CM | POA: Insufficient documentation

## 2021-03-28 DIAGNOSIS — H2511 Age-related nuclear cataract, right eye: Secondary | ICD-10-CM | POA: Insufficient documentation

## 2021-03-28 HISTORY — PX: CATARACT EXTRACTION W/PHACO: SHX586

## 2021-03-28 SURGERY — PHACOEMULSIFICATION, CATARACT, WITH IOL INSERTION
Anesthesia: Monitor Anesthesia Care | Site: Eye | Laterality: Right

## 2021-03-28 MED ORDER — SIGHTPATH DOSE#1 BSS IO SOLN
INTRAOCULAR | Status: DC | PRN
  Administered 2021-03-28: 80 mL via OPHTHALMIC

## 2021-03-28 MED ORDER — SIGHTPATH DOSE#1 SODIUM HYALURONATE 23 MG/ML IO SOLUTION
PREFILLED_SYRINGE | INTRAOCULAR | Status: DC | PRN
  Administered 2021-03-28: 0.6 mL via INTRAOCULAR

## 2021-03-28 MED ORDER — ACETAMINOPHEN 325 MG PO TABS
325.0000 mg | ORAL_TABLET | Freq: Once | ORAL | Status: DC
Start: 2021-03-28 — End: 2021-03-28

## 2021-03-28 MED ORDER — SIGHTPATH DOSE#1 BSS IO SOLN
INTRAOCULAR | Status: DC | PRN
  Administered 2021-03-28: 15 mL

## 2021-03-28 MED ORDER — FENTANYL CITRATE (PF) 100 MCG/2ML IJ SOLN
INTRAMUSCULAR | Status: DC | PRN
  Administered 2021-03-28: 100 ug via INTRAVENOUS

## 2021-03-28 MED ORDER — TETRACAINE HCL 0.5 % OP SOLN
1.0000 [drp] | OPHTHALMIC | Status: DC | PRN
  Administered 2021-03-28 (×3): 1 [drp] via OPHTHALMIC

## 2021-03-28 MED ORDER — LACTATED RINGERS IV SOLN: INTRAVENOUS | Status: DC

## 2021-03-28 MED ORDER — LIDOCAINE HCL (PF) 2 % IJ SOLN
INTRAOCULAR | Status: DC | PRN
  Administered 2021-03-28: 1 mL via INTRAOCULAR

## 2021-03-28 MED ORDER — ARMC OPHTHALMIC DILATING DROPS
1.0000 "application " | OPHTHALMIC | Status: DC | PRN
  Administered 2021-03-28 (×3): 1 via OPHTHALMIC

## 2021-03-28 MED ORDER — MIDAZOLAM HCL 2 MG/2ML IJ SOLN
INTRAMUSCULAR | Status: DC | PRN
  Administered 2021-03-28: 2 mg via INTRAVENOUS

## 2021-03-28 MED ORDER — ACETAMINOPHEN 160 MG/5ML PO SOLN
325.0000 mg | Freq: Once | ORAL | Status: DC
Start: 2021-03-28 — End: 2021-03-28

## 2021-03-28 MED ORDER — SIGHTPATH DOSE#1 SODIUM HYALURONATE 10 MG/ML IO SOLUTION
PREFILLED_SYRINGE | INTRAOCULAR | Status: DC | PRN
  Administered 2021-03-28: 0.85 mL via INTRAOCULAR

## 2021-03-28 MED ORDER — MOXIFLOXACIN HCL 0.5 % OP SOLN
OPHTHALMIC | Status: DC | PRN
  Administered 2021-03-28: 0.2 mL via OPHTHALMIC

## 2021-03-28 SURGICAL SUPPLY — 18 items
CANNULA ANT/CHMB 27GA (MISCELLANEOUS) ×2 IMPLANT
DISSECTOR HYDRO NUCLEUS 50X22 (MISCELLANEOUS) ×2 IMPLANT
GLOVE SURG GAMMEX PI TX LF 7.5 (GLOVE) ×2 IMPLANT
GLOVE SURG SYN 8.5  E (GLOVE) ×1
GLOVE SURG SYN 8.5 E (GLOVE) ×1 IMPLANT
GOWN STRL REUS W/ TWL LRG LVL3 (GOWN DISPOSABLE) ×2 IMPLANT
GOWN STRL REUS W/TWL LRG LVL3 (GOWN DISPOSABLE) ×4
LENS IOL ACRSF IQ PAN 21.5 ×1 IMPLANT
LENS IOL IQ PANOPTIX 21.5 ×2 IMPLANT
MARKER SKIN DUAL TIP RULER LAB (MISCELLANEOUS) ×2 IMPLANT
NEEDLE FILTER BLUNT 18X 1/2SAF (NEEDLE) ×1
NEEDLE FILTER BLUNT 18X1 1/2 (NEEDLE) ×1 IMPLANT
PACK EYE AFTER SURG (MISCELLANEOUS) ×2 IMPLANT
SYR 3ML LL SCALE MARK (SYRINGE) ×2 IMPLANT
SYR 5ML LL (SYRINGE) ×2 IMPLANT
SYR TB 1ML LUER SLIP (SYRINGE) ×2 IMPLANT
WATER STERILE IRR 250ML POUR (IV SOLUTION) ×2 IMPLANT
WIPE NON LINTING 3.25X3.25 (MISCELLANEOUS) ×2 IMPLANT

## 2021-03-28 NOTE — H&P (Signed)
[facility]   Primary Care Physician:  Marina Goodell, MD Ophthalmologist: Dr. Willey Blade  Pre-Procedure History & Physical: HPI:  Gary Esparza is a 75 y.o. male here for cataract surgery.   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  Found on MRI.  No deficits   Wears dentures    partial upper and lower    Past Surgical History:  Procedure Laterality Date   BROW LIFT Bilateral 10/08/2020   Procedure: BLEPHAROPLASTY UPPER EYELID; W/EXCESS SKIN BILATERAL;  Surgeon: Imagene Riches, MD;  Location: [facility];  Service: Ophthalmology;  Laterality: Bilateral;   CATARACT EXTRACTION W/PHACO Left 03/14/2021   Procedure: CATARACT EXTRACTION PHACO AND INTRAOCULAR LENS PLACEMENT (IOC) LEFT PANOPTIX  2.12 00:21.2;  Surgeon: Nevada Crane, MD;  Location: [facility];  Service: Ophthalmology;  Laterality: Left;   COLONOSCOPY WITH PROPOFOL N/A 12/01/2019   Procedure: COLONOSCOPY WITH PROPOFOL;  Surgeon: Toney Reil, MD;  Location: [facility] END[facility];  Service: Gastroenterology;  Laterality: N/A;   ESOPHAGOGASTRODUODENOSCOPY (EGD) WITH PROPOFOL N/A 12/01/2019   Procedure: ESOPHAGOGASTRODUODENOSCOPY (EGD) WITH PROPOFOL;  Surgeon: Toney Reil, MD;  Location: [facility] ENDOSCOPY;  Service: Gastroenterology;  Laterality: N/A;    Prior to Admission medications   Medication Sig Start Date End Date Taking? Authorizing Provider  aspirin 81 MG EC tablet Take 1 tablet by mouth daily.   Yes [provider]  cetirizine (ZYRTEC) 10 MG tablet Take 10 mg by mouth daily.   Yes [provider]  Cholecalciferol (VITAMIN D-3) 125 MCG (5000 UT) TABS Take by mouth.   Yes [provider]  Corn Dextrin ([employer] EASY FIBER) POWD Take by mouth.   Yes [provider]  finasteride (PROSCAR) 5  MG tablet TAKE 1 TABLET DAILY 11/11/20  Yes Sondra Come, MD  losartan (COZAAR) 25 MG tablet Take by mouth.   Yes [provider]  MELATONIN PO Take by mouth as needed.   Yes [provider]  Multiple Vitamins-Minerals (VITRUM SENIOR) TABS Take by mouth.   Yes [provider]  Probiotic Product (PROBIOTIC-10 PO) Take by mouth.   Yes [provider]  rosuvastatin (CRESTOR) 20 MG tablet Take 1 tablet by mouth daily. 04/14/20  Yes [provider]  tadalafil (CIALIS) 5 MG tablet Take 1 tablet (5 mg total) by mouth daily as needed for erectile dysfunction. 08/24/20  Yes Sondra Come, MD  erythromycin ophthalmic ointment Apply to sutures 4 times a day for 10-12 days.  Discontinue if allergy develops and call our office Patient not taking: Reported on 03/02/2021 10/08/20   Imagene Riches, MD  traMADol Janean Sark) 50 MG tablet Take 1 every 4-6 hours as needed for pain not controlled by Tylenol Patient not taking: Reported on 03/02/2021 10/08/20   Imagene Riches, MD    Allergies as of 02/03/2021 - Review Complete 10/08/2020  Allergen Reaction Noted   Sulfa antibiotics Rash 09/14/2020    History reviewed. No pertinent family history.  Social History   Socioeconomic History   Marital status: Widowed    Spouse name: Not on file   Number of children: Not on file   Years of education: Not on file   Highest education level: Not on file  Occupational History   Not on file  Tobacco Use   Smoking status: Never   Smokeless tobacco: Never  Vaping Use   Vaping Use: Never used  Substance and Sexual Activity   Alcohol use: Yes    Comment: 2 a month    Drug use: Never   Sexual activity: Not on file  Other Topics Concern   Not on file  Social History Narrative   Not on file   Social Determinants of Health   Financial Resource Strain: Not on file  Food Insecurity: Not on file  Transportation Needs: Not on file  Physical Activity: Not on file  Stress:  Not on file  Social Connections: Not on file  Intimate Partner Violence: Not on file    Review of Systems: See HPI, otherwise negative ROS  Physical Exam: BP (!) 138/93   Pulse 71   Temp (!) 97 F (36.1 C)   Wt 93 kg   SpO2 98%   BMI 31.17 kg/m  General:   Alert, cooperative in NAD Head:  Normocephalic and atraumatic. Respiratory:  Normal work of breathing. Cardiovascular:  RRR  Impression/Plan: Gary Esparza is here for cataract surgery.  Risks, benefits, limitations, and alternatives regarding cataract surgery have been reviewed with the patient.  Questions have been answered.  All parties agreeable.   Benay Pillow, MD  03/28/2021, 8:58 AM

## 2021-03-28 NOTE — Transfer of Care (Signed)
Immediate Anesthesia Transfer of Care Note  Patient: Gary Esparza  Procedure(s) Performed: CATARACT EXTRACTION PHACO AND INTRAOCULAR LENS PLACEMENT (IOC) RIGHT PANOPTIX TORIC (Right: Eye)  Patient Location: PACU  Anesthesia Type: MAC  Level of Consciousness: awake, alert  and patient cooperative  Airway and Oxygen Therapy: Patient Spontanous Breathing and Patient connected to supplemental oxygen  Post-op Assessment: Post-op Vital signs reviewed, Patient's Cardiovascular Status Stable, Respiratory Function Stable, Patent Airway and No signs of Nausea or vomiting  Post-op Vital Signs: Reviewed and stable  Complications: No notable events documented.

## 2021-03-28 NOTE — Anesthesia Preprocedure Evaluation (Signed)
Anesthesia Evaluation  Patient identified by MRN, date of birth, ID band Patient awake    Reviewed: Allergy & Precautions, H&P , NPO status , Patient's Chart, lab work & pertinent test results  Airway Mallampati: II  TM Distance: >3 FB Neck ROM: full    Dental no notable dental hx.    Pulmonary neg pulmonary ROS,    Pulmonary exam normal breath sounds clear to auscultation       Cardiovascular Exercise Tolerance: Good hypertension, Normal cardiovascular exam Rhythm:regular Rate:Normal     Neuro/Psych negative neurological ROS  negative psych ROS   GI/Hepatic Neg liver ROS, GERD  Medicated and Controlled,  Endo/Other    Renal/GU negative Renal ROS  negative genitourinary   Musculoskeletal  (+) Arthritis ,   Abdominal (+) + obese,   Peds  Hematology negative hematology ROS (+)   Anesthesia Other Findings   Reproductive/Obstetrics                             Anesthesia Physical  Anesthesia Plan  ASA: 2  Anesthesia Plan: MAC   Post-op Pain Management:    Induction:   PONV Risk Score and Plan: 1 and Treatment may vary due to age or medical condition, TIVA and Midazolam  Airway Management Planned: Nasal Cannula and Natural Airway  Additional Equipment:   Intra-op Plan:   Post-operative Plan:   Informed Consent: I have reviewed the patients History and Physical, chart, labs and discussed the procedure including the risks, benefits and alternatives for the proposed anesthesia with the patient or authorized representative who has indicated his/her understanding and acceptance.     Dental Advisory Given  Plan Discussed with: CRNA  Anesthesia Plan Comments:         Anesthesia Quick Evaluation

## 2021-03-28 NOTE — Anesthesia Postprocedure Evaluation (Signed)
Anesthesia Post Note  Patient: Gary Esparza  Procedure(s) Performed: CATARACT EXTRACTION PHACO AND INTRAOCULAR LENS PLACEMENT ([place]) RIGHT PANOPTIX TORIC (Right: Eye)     Patient location during evaluation: PACU Anesthesia Type: MAC Level of consciousness: awake and alert and oriented Pain management: satisfactory to patient Vital Signs Assessment: post-procedure vital signs reviewed and stable Respiratory status: spontaneous breathing, nonlabored ventilation and respiratory function stable Cardiovascular status: blood pressure returned to baseline and stable Postop Assessment: Adequate PO intake and No signs of nausea or vomiting Anesthetic complications: no   No notable events documented.  Raliegh Ip

## 2021-03-28 NOTE — Op Note (Signed)
OPERATIVE NOTE  Gary Esparza [id number] 03/28/2021   PREOPERATIVE DIAGNOSIS:  Nuclear sclerotic cataract right eye.  H25.11   POSTOPERATIVE DIAGNOSIS:    Nuclear sclerotic cataract right eye.     PROCEDURE:  Phacoemusification with posterior chamber intraocular lens placement of the right eye   LENS:   Implant Name Type Inv. Item Serial No. Manufacturer Lot No. LRB No. Used Action  LENS IOL IQ PANOPTIX 21.5 - [id number]  LENS IOL IQ PANOPTIX 21.5 [id number] ALCON  Right 1 Implanted       Procedure(s) with comments: CATARACT EXTRACTION PHACO AND INTRAOCULAR LENS PLACEMENT (IOC) RIGHT PANOPTIX TORIC (Right) - 3.21 00:33.0  TFNT00 +21.5   SURGEON:  Willey Blade, MD, MPH  ANESTHESIOLOGIST: Anesthesiologist: Ranee Gosselin, MD CRNA: Michaele Offer, CRNA   ANESTHESIA:  Topical with tetracaine drops augmented with 1% preservative-free intracameral lidocaine.  ESTIMATED BLOOD LOSS: less than 1 mL.   COMPLICATIONS:  None.   DESCRIPTION OF PROCEDURE:  The patient was identified in the holding room and transported to the operating room and placed in the supine position under the operating microscope.  The right eye was identified as the operative eye and it was prepped and draped in the usual sterile ophthalmic fashion.   A 1.0 millimeter clear-corneal paracentesis was made at the 10:30 position. 0.5 ml of preservative-free 1% lidocaine with epinephrine was injected into the anterior chamber.  The anterior chamber was filled with Healon 5 viscoelastic.  A 2.4 millimeter keratome was used to make a near-clear corneal incision at the 8:00 position.  A curvilinear capsulorrhexis was made with a cystotome and capsulorrhexis forceps.  Balanced salt solution was used to hydrodissect and hydrodelineate the nucleus.   Phacoemulsification was then used in stop and chop fashion to remove the lens nucleus and epinucleus.  The remaining cortex was then removed using the irrigation and aspiration  handpiece. Healon was then placed into the capsular bag to distend it for lens placement.  A lens was then injected into the capsular bag.  The remaining viscoelastic was aspirated.   Wounds were hydrated with balanced salt solution.  The anterior chamber was inflated to a physiologic pressure with balanced salt solution.   Intracameral vigamox 0.1 mL undiluted was injected into the eye and a drop placed onto the ocular surface.  No wound leaks were noted.  The patient was taken to the recovery room in stable condition without complications of anesthesia or surgery  Willey Blade 03/28/2021, 9:26 AM

## 2021-03-29 ENCOUNTER — Encounter: Payer: Self-pay | Admitting: Ophthalmology

## 2021-08-23 ENCOUNTER — Encounter: Payer: Self-pay | Admitting: Urology

## 2021-08-23 ENCOUNTER — Ambulatory Visit (INDEPENDENT_AMBULATORY_CARE_PROVIDER_SITE_OTHER): Payer: Medicare Other | Admitting: Urology

## 2021-08-23 VITALS — BP 151/82 | HR 71 | Ht 67.0 in | Wt 202.0 lb

## 2021-08-23 DIAGNOSIS — N401 Enlarged prostate with lower urinary tract symptoms: Secondary | ICD-10-CM

## 2021-08-23 DIAGNOSIS — N138 Other obstructive and reflux uropathy: Secondary | ICD-10-CM

## 2021-08-23 DIAGNOSIS — R310 Gross hematuria: Secondary | ICD-10-CM

## 2021-08-23 DIAGNOSIS — N529 Male erectile dysfunction, unspecified: Secondary | ICD-10-CM

## 2021-08-23 LAB — BLADDER SCAN AMB NON-IMAGING

## 2021-08-23 MED ORDER — FINASTERIDE 5 MG PO TABS
5.0000 mg | ORAL_TABLET | Freq: Every day | ORAL | 3 refills | Status: DC
Start: 2021-08-23 — End: 2021-08-23

## 2021-08-23 MED ORDER — FINASTERIDE 5 MG PO TABS: 5.0000 mg | ORAL_TABLET | Freq: Every day | ORAL | 3 refills | Status: DC

## 2021-08-23 NOTE — Progress Notes (Signed)
? ?  08/23/2021 ?1:04 PM  ? ?Juleen Starr ?1946/01/07 ?[id number] ? ?Reason for visit: Follow up gross hematuria, BPH, ED ? ?HPI: ?I saw Mr. Avalon back in clinic for the above issues.  Briefly he is a healthy 76 year old male with a history of TURP in 2011 and he was having recurrent episodes of significant gross hematuria over the last few years including with significant clots.  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  Cytology was negative. ?  ?He opted for trial of finasteride at that time for his recurrent gross hematuria, and has done well since then.  He denies any episodes of gross hematuria over the last year.  He had a single episode of painless hematospermia.  We reviewed this is typically a benign process.  He denies any significant urinary symptoms aside from some urinary frequency when he drinks a lot of fluid, and PVR is normal at 20 mL. ?  ?At our visit last year he reported some problems with maintaining erections, and he opted for trial of Cialis 5 to 10 mg on demand.  This has worked well. ? ?He had questions about PSA screening today.  We reviewed the AUA guidelines that do not recommend routine screening in men over age 8, and risks and benefits discussed.  He is comfortable deferring PSA screening per guideline recommendations. ? ?He also was recently diagnosed with sleep apnea, and recently started on CPAP machine. ? ?Finasteride refilled ?RTC 1 year IPSS and PVR ? ?Billey Co, MD ? ?[facility] ?[address], [address] ?[place], [place] [address] ?[phone] ? ? ?

## 2021-08-23 NOTE — Addendum Note (Signed)
Addended by: Frankey Shown on: 08/23/2021 01:23 PM ? ? Modules accepted: Orders ? ?

## 2022-08-29 ENCOUNTER — Ambulatory Visit (INDEPENDENT_AMBULATORY_CARE_PROVIDER_SITE_OTHER): Payer: Medicare Other | Admitting: Urology

## 2022-08-29 VITALS — BP 161/78 | HR 69 | Ht 67.0 in | Wt 200.0 lb

## 2022-08-29 DIAGNOSIS — N401 Enlarged prostate with lower urinary tract symptoms: Secondary | ICD-10-CM | POA: Diagnosis not present

## 2022-08-29 DIAGNOSIS — N529 Male erectile dysfunction, unspecified: Secondary | ICD-10-CM | POA: Diagnosis not present

## 2022-08-29 DIAGNOSIS — N138 Other obstructive and reflux uropathy: Secondary | ICD-10-CM

## 2022-08-29 LAB — BLADDER SCAN AMB NON-IMAGING

## 2022-08-29 MED ORDER — TADALAFIL 5 MG PO TABS: 5.0000 mg | ORAL_TABLET | Freq: Every day | ORAL | 11 refills | Status: AC | PRN

## 2022-08-29 MED ORDER — FINASTERIDE 5 MG PO TABS: 5.0000 mg | ORAL_TABLET | Freq: Every day | ORAL | 3 refills | Status: DC

## 2022-08-29 NOTE — Progress Notes (Signed)
   08/29/2022 1:38 PM   Gary Esparza May 31, 1945 [id number]  Reason for visit: Follow up gross hematuria, BPH, ED  HPI: I saw Gary Esparza back in clinic for the above issues.  He is a healthy 77 year old male with a history of TURP in 2011 and he was having recurrent episodes of significant gross hematuria over the last few years including with significant clots.  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  Cytology was negative.   He opted for trial of finasteride at that time for his recurrent gross hematuria secondary to BPH, and has done well since then.  He denies any episodes of gross hematuria over the last year.  He did [place] any urinary complaints, and IPSS score today is 8, with quality-of-life pleased, PVR normal at 18ml.  Reports nocturia once overnight.  We reviewed other options like HOLEP, but he is not interested in surgical treatments at this time.   He is using Cialis 5 to 10 mg on demand for ED, his wife has some other health issues, but he would like to continue that medication.  Previously have reviewed the AUA guidelines regarding PSA screening, and he opted to discontinue screening per recommendations against screening in men over age 73.  Finasteride and Cialis refilled RTC 1 year PVR  Sondra Come, MD  [facility] [address], [address] [place], [place] [address] [phone]

## 2022-09-10 ENCOUNTER — Emergency Department
Admission: EM | Admit: 2022-09-10 | Discharge: 2022-09-10 | Disposition: A | Discharge status: Home / Self Care | Payer: Medicare Other | Attending: Emergency Medicine | Admitting: Emergency Medicine

## 2022-09-10 ENCOUNTER — Other Ambulatory Visit: Payer: Self-pay

## 2022-09-10 ENCOUNTER — Emergency Department: Payer: Medicare Other

## 2022-09-10 ENCOUNTER — Encounter: Payer: Self-pay | Admitting: Emergency Medicine

## 2022-09-10 DIAGNOSIS — I10 Essential (primary) hypertension: Secondary | ICD-10-CM | POA: Diagnosis not present

## 2022-09-10 DIAGNOSIS — M79602 Pain in left arm: Secondary | ICD-10-CM | POA: Diagnosis present

## 2022-09-10 DIAGNOSIS — R29898 Other symptoms and signs involving the musculoskeletal system: Secondary | ICD-10-CM | POA: Insufficient documentation

## 2022-09-10 LAB — CBC WITH DIFFERENTIAL/PLATELET
Abs Immature Granulocytes: 0.02 10*3/uL (ref 0.00–0.07)
Basophils Absolute: 0.1 10*3/uL (ref 0.0–0.1)
Basophils Relative: 1 %
Eosinophils Absolute: 0.1 10*3/uL (ref 0.0–0.5)
Eosinophils Relative: 2 %
HCT: 45.1 % (ref 39.0–52.0)
Hemoglobin: 15 g/dL (ref 13.0–17.0)
Immature Granulocytes: 0 %
Lymphocytes Relative: 20 %
Lymphs Abs: 1.2 10*3/uL (ref 0.7–4.0)
MCH: 29.5 pg (ref 26.0–34.0)
MCHC: 33.3 g/dL (ref 30.0–36.0)
MCV: 88.8 fL (ref 80.0–100.0)
Monocytes Absolute: 0.6 10*3/uL (ref 0.1–1.0)
Monocytes Relative: 10 %
Neutro Abs: 3.9 10*3/uL (ref 1.7–7.7)
Neutrophils Relative %: 67 %
Platelets: 228 10*3/uL (ref 150–400)
RBC: 5.08 MIL/uL (ref 4.22–5.81)
RDW: 13.2 % (ref 11.5–15.5)
WBC: 5.9 10*3/uL (ref 4.0–10.5)
nRBC: 0 % (ref 0.0–0.2)

## 2022-09-10 LAB — COMPREHENSIVE METABOLIC PANEL
ALT: 20 U/L (ref 0–44)
AST: 24 U/L (ref 15–41)
Albumin: 4.6 g/dL (ref 3.5–5.0)
Alkaline Phosphatase: 37 U/L — ABNORMAL LOW (ref 38–126)
Anion gap: 7 (ref 5–15)
BUN: 16 mg/dL (ref 8–23)
CO2: 26 mmol/L (ref 22–32)
Calcium: 8.8 mg/dL — ABNORMAL LOW (ref 8.9–10.3)
Chloride: 107 mmol/L (ref 98–111)
Creatinine, Ser: 0.92 mg/dL (ref 0.61–1.24)
GFR, Estimated: >60 mL/min (ref >60)
Glucose, Bld: 112 mg/dL — ABNORMAL HIGH (ref 70–99)
Potassium: 3.8 mmol/L (ref 3.5–5.1)
Sodium: 140 mmol/L (ref 135–145)
Total Bilirubin: 0.7 mg/dL (ref 0.3–1.2)
Total Protein: 7 g/dL (ref 6.5–8.1)

## 2022-09-10 LAB — TROPONIN I (HIGH SENSITIVITY): Troponin I (High Sensitivity): 3 ng/L (ref <18)

## 2022-09-10 NOTE — ED Provider Notes (Signed)
[facility] Provider Note    Event Date/Time   First MD Initiated Contact with Patient 09/10/22 1021     (approximate)   History   Arm Pain   HPI  Gary Esparza is a 77 y.o. male with history of hypertension, lacunar infarcts presents emergency department complaining of awaking this morning with left arm pain and weakness.  Patient denies chest pain or shortness of breath.  States that the arm continues to feel weak.  Is concerned he might have had a stroke.  No slurred speech.  No facial droop per his wife.  No difficulty walking.      Physical Exam   Triage Vital Signs: ED Triage Vitals  Enc Vitals Group     BP 09/10/22 1000 (!) 152/84     Pulse Rate 09/10/22 1000 67     Resp 09/10/22 1000 16     Temp 09/10/22 1000 98.2 F (36.8 C)     Temp Source 09/10/22 1000 Oral     SpO2 09/10/22 1000 97 %     Weight 09/10/22 0958 200 lb (90.7 kg)     Height 09/10/22 0958 5\' 7"  (1.702 m)     Head Circumference --      Peak Flow --      Pain Score 09/10/22 0958 0     Pain Loc --      Pain Edu? --      Excl. in GC? --     Most recent vital signs: Vitals:   09/10/22 1000  BP: (!) 152/84  Pulse: 67  Resp: 16  Temp: 98.2 F (36.8 C)  SpO2: 97%     General: Awake, no distress.   CV:  Good peripheral perfusion. regular rate and  rhythm Resp:  Normal effort. Lungs cta Abd:  No distention.   Other:  Cranial nerves II through XII grossly intact, grips are equal bilaterally, patient does have minimal weakness in the left arm on examination, no facial droop noted   ED Results / Procedures / Treatments   Labs (all labs ordered are listed, but only abnormal results are displayed) Labs Reviewed  COMPREHENSIVE METABOLIC PANEL - Abnormal; Notable for the following components:      Result Value   Glucose, Bld 112 (*)    Calcium 8.8 (*)    Alkaline Phosphatase 37 (*)    All other components within normal limits  CBC WITH DIFFERENTIAL/PLATELET   TROPONIN I (HIGH SENSITIVITY)  TROPONIN I (HIGH SENSITIVITY)     EKG  EKG   RADIOLOGY CT of the head, chest x-ray    PROCEDURES:   Procedures   MEDICATIONS ORDERED IN ED: Medications - No data to display   IMPRESSION / MDM / ASSESSMENT AND PLAN / ED COURSE  I reviewed the triage vital signs and the nursing notes.                              Differential diagnosis includes, but is not limited to, CVA, SAH, MI, cervical radiculopathy  Patient's presentation is most consistent with acute presentation with potential threat to life or bodily function.   Imaging and labs ordered.  Patient is out of the 4-hour window so will not activate code stroke.   Patient's labs are reassuring  CT of the head independently reviewed and interpreted by me as being negative for any acute abnormality  EKG shows normal sinus rhythm, see physician read  MRI of the brain without contrast, did review the radiologist reading.  I interpret this as being negative for any acute abnormality  I did explain all of these findings to the patient.  He is to follow-up with his regular doctor.  His regular doctor is already put in a referral for cardiology.  Follow-up with cardiology.  Return emergency department if worsening.  Patient is in agreement treatment plan.  Was discharged stable condition.   FINAL CLINICAL IMPRESSION(S) / ED DIAGNOSES   Final diagnoses:  Left arm weakness     Rx / DC Orders   ED Discharge Orders     None        Note:  This document was prepared using Dragon voice recognition software and may include unintentional dictation errors.    Faythe Ghee, PA-C 09/10/22 1324    Phineas Semen, MD 09/10/22 [phone]

## 2022-09-10 NOTE — ED Triage Notes (Signed)
Pt to ED via POV. Pt states that when he woke up this morning his left arm was hurting. Pt states that the pain has now went away but that his arm feels weak. Pt states that his blood pressure was elevated this morning as well. Pt reports that his BP reading were in the 140's and 150's. Pt states that he does take BP medication. Pt also reports that his pulse was lower than normal this morning. Pts pulse was in the upper 50's and lower 60's. Pt is currently in NAD.

## 2022-09-10 NOTE — ED Notes (Addendum)
See triage note  Presents with left arm discomfort  Denies any discomfort at present but states his arm feels weaker  Grips equal  No slurred speech  Gait is steady  Last known well was last pm  States he has had a lunar infarct in the past

## 2023-02-27 ENCOUNTER — Other Ambulatory Visit: Payer: Self-pay | Admitting: Student

## 2023-02-27 ENCOUNTER — Ambulatory Visit
Admission: RE | Admit: 2023-02-27 | Discharge: 2023-02-27 | Disposition: A | Discharge status: Home / Self Care | Payer: Medicare Other | Source: Ambulatory Visit | Attending: Student | Admitting: Student

## 2023-02-27 DIAGNOSIS — R200 Anesthesia of skin: Secondary | ICD-10-CM

## 2023-03-20 ENCOUNTER — Other Ambulatory Visit: Payer: Self-pay | Admitting: Student

## 2023-03-20 DIAGNOSIS — I6381 Other cerebral infarction due to occlusion or stenosis of small artery: Secondary | ICD-10-CM

## 2023-03-26 ENCOUNTER — Ambulatory Visit
Admission: RE | Admit: 2023-03-26 | Discharge: 2023-03-26 | Disposition: A | Discharge status: Home / Self Care | Payer: Medicare Other | Source: Ambulatory Visit | Attending: Student | Admitting: Student

## 2023-03-26 DIAGNOSIS — I6381 Other cerebral infarction due to occlusion or stenosis of small artery: Secondary | ICD-10-CM | POA: Insufficient documentation

## 2023-09-04 ENCOUNTER — Ambulatory Visit (INDEPENDENT_AMBULATORY_CARE_PROVIDER_SITE_OTHER): Payer: Self-pay | Admitting: Urology

## 2023-09-04 ENCOUNTER — Encounter: Payer: Self-pay | Admitting: Urology

## 2023-09-04 VITALS — BP 143/77 | Ht 67.0 in | Wt 200.0 lb

## 2023-09-04 DIAGNOSIS — N401 Enlarged prostate with lower urinary tract symptoms: Secondary | ICD-10-CM

## 2023-09-04 DIAGNOSIS — N138 Other obstructive and reflux uropathy: Secondary | ICD-10-CM

## 2023-09-04 LAB — BLADDER SCAN AMB NON-IMAGING: Scan Result: 3

## 2023-09-04 MED ORDER — FINASTERIDE 5 MG PO TABS
5.0000 mg | ORAL_TABLET | Freq: Every day | ORAL | 3 refills | Status: AC
Start: 2023-09-04 — End: ?

## 2023-09-04 NOTE — Progress Notes (Signed)
   09/04/2023 1:40 PM   Gary Esparza 09/02/1945 [id number]  Reason for visit: Follow up gross hematuria, ED  HPI: I saw [name] back in clinic for the above issues.  He is a healthy 78 year old male with a history of TURP in 2011 and he was having recurrent episodes of significant gross hematuria including with significant clots.  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  Cytology was negative.   He opted for trial of finasteride  at that time for his recurrent gross hematuria secondary to BPH, and has done well since then.  He denies any episodes of gross hematuria over the last year.  He denies any urinary complaints,  PVR normal at 3ml.  We reviewed other options like HOLEP, but he is not interested in surgical treatments at this time.   Previously was using Cialis  5 to 10 mg on demand, he and his wife are no longer sexually active and he does not need refills of that medication.   Previously have reviewed the AUA guidelines regarding PSA screening, and he opted to discontinue screening per recommendations against screening in men over age 77.  Finasteride  refilled, RTC 1 year PVR  Lawerence Pressman, MD  [facility] [address], [address] [place], [place] [address] [phone]

## 2023-11-08 ENCOUNTER — Other Ambulatory Visit: Payer: Self-pay | Admitting: Neurology

## 2023-11-08 DIAGNOSIS — H539 Unspecified visual disturbance: Secondary | ICD-10-CM

## 2023-11-08 DIAGNOSIS — M5416 Radiculopathy, lumbar region: Secondary | ICD-10-CM

## 2023-11-11 ENCOUNTER — Ambulatory Visit
Admission: RE | Admit: 2023-11-11 | Discharge: 2023-11-11 | Disposition: A | Discharge status: Home / Self Care | Source: Ambulatory Visit | Attending: Neurology | Admitting: Neurology

## 2023-11-11 DIAGNOSIS — M5416 Radiculopathy, lumbar region: Secondary | ICD-10-CM | POA: Insufficient documentation

## 2023-11-11 DIAGNOSIS — H539 Unspecified visual disturbance: Secondary | ICD-10-CM | POA: Diagnosis present

## 2023-11-20 ENCOUNTER — Ambulatory Visit: Attending: Family Medicine | Admitting: Speech Pathology

## 2023-11-20 DIAGNOSIS — R0989 Other specified symptoms and signs involving the circulatory and respiratory systems: Secondary | ICD-10-CM | POA: Insufficient documentation

## 2023-11-20 DIAGNOSIS — R1310 Dysphagia, unspecified: Secondary | ICD-10-CM | POA: Diagnosis present

## 2023-11-20 NOTE — Therapy (Signed)
 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  Found on MRI.  No deficits   Wears dentures    partial upper and lower   Past Surgical History:  Procedure Laterality Date   BROW LIFT Bilateral 10/08/2020   Procedure: BLEPHAROPLASTY UPPER EYELID; W/EXCESS SKIN BILATERAL;  Surgeon: Ashley Greig HERO, MD;  Location: [facility];  Service: Ophthalmology;  Laterality: Bilateral;   CATARACT EXTRACTION W/PHACO Left 03/14/2021   Procedure: CATARACT EXTRACTION PHACO AND INTRAOCULAR LENS PLACEMENT (IOC) LEFT PANOPTIX  2.12 00:21.2;  Surgeon: Myrna Adine Anes, MD;  Location: [facility];  Service: Ophthalmology;  Laterality: Left;   CATARACT EXTRACTION W/PHACO Right 03/28/2021   Procedure: CATARACT EXTRACTION PHACO AND INTRAOCULAR LENS PLACEMENT (IOC) RIGHT PANOPTIX TORIC;  Surgeon: Myrna Adine Anes, MD;  Location: [facility];  Service: Ophthalmology;  Laterality: Right;  3.21 00:33.0   COLONOSCOPY WITH PROPOFOL  N/A 12/01/2019   Procedure: COLONOSCOPY WITH PROPOFOL ;  Surgeon: Unk Corinn Skiff, MD;  Location: [facility];  Service: Gastroenterology;  Laterality: N/A;   ESOPHAGOGASTRODUODENOSCOPY (EGD) WITH PROPOFOL  N/A 12/01/2019   Procedure: ESOPHAGOGASTRODUODENOSCOPY (EGD) WITH PROPOFOL ;  Surgeon: Unk Corinn Skiff, MD;  Location: [facility] ENDOSCOPY;  Service: Gastroenterology;  Laterality:  N/A;   Patient Active Problem List   Diagnosis Date Noted   Gastroesophageal reflux disease    Altered bowel habits     ONSET DATE: date of referral 07/04/2023: 10/08/2019  REFERRING DIAG: R09.89 (ICD-10-CM) - Choking sensation   THERAPY DIAG:  Choking sensation  Dysphagia, unspecified type  Rationale for Evaluation and Treatment Rehabilitation  SUBJECTIVE:   SUBJECTIVE STATEMENT: Pt pleasant, good historian Pt accompanied by: significant other  PERTINENT HISTORY and DIAGNOSTIC FINDINGS:  Pt is a 78 year old male with past medical history of hypertension, hyperlipidemia, aortic atherosclerosis, cerebrovascular disease, sleep apnea and choking sensation.   Per chart review pt with referred for GI consultation on 10/08/2019 for Chronic GERD: Patient reports chronic burning in his throat and regurgitation of acid which is intermittent, triggered after eating certain foods. Patient cannot eat late at night, reports nocturnal heartburn.   EGD and colonoscopy 12/01/2019 - Normal esophagus and gastroesophageal junction. - Normal stomach. - Normal examined duodenum. - Esophagogastric landmarks identified. - No specimens collected.  PAIN:  Are you having pain? No  FALLS: Has patient fallen in last 6 months?  No  LIVING ENVIRONMENT: Lives with: lives with an adult companion Lives in: House/apartment  PLOF:  Level of assistance: Independent with ADLs, Independent with IADLs Employment: Retired   PATIENT GOALS  to evaluate choking sensation  OBJECTIVE:   COGNITION: Overall cognitive status: Within functional limits for tasks assessed  ORAL MOTOR EXAMINATION Facial : WFL Lingual: WFL Velum: WFL Mandible: WFL Cough: WFL Voice: WFL   CLINICAL SWALLOW ASSESSMENT:  Current diet: regular and thin liquids Dentition: adequate natural dentition Feeding: able to feed self Consistencies tested: Thin Liquid: Presentation: Cup and Self-fed Oral Phase: WFL Pharyngeal  Phase: WFL Puree: Presentation: Spoon and Self-fed Oral Phase: WFL Pharyngeal Phase: WFL Regular: Presentation: Spoon and By hand Oral Phase: WFL Pharyngeal Phase: WFL Other: mixed consistency (fruit cocktail) Presentation: Spoon and Self-fed Oral Phase: WFL Pharyngeal Phase: WFL Score on the Mann Assessment of Swallowing Ability (MASA): 200 = no abnormality detected   Evaluation findings: Patient presents with oropharyngeal swallow which appears clinically to be within functional limits with adequate airway protection. Oral stage is characterized by appearance of adequate oral containment, mastication, bolus formation, oral transfer and oral clearance. Swallow initiation appears timely. No overt signs of aspiration observed or report of globus sensation.   Aspiration risk factors:History of GERD Overall aspiration risk:No limitations Diet Recommendations: regular and thin liquids Precautions:Minimize environmental distractions, Slow rate, Small sips/bites, Seated upright 90 degrees, and Remain upright for at least 30 minutes after meals Supervision: Patient able to feed self Oral care recommendations:Oral care BID Follow-up recommendations: No treatment recommended     PATIENT REPORTED OUTCOME MEASURES (PROM): Reflux Symptom Index These are statements many people have used to describe their voices and the effects of their voices on their lives. In the last  month, how did the following problems affect you?   Hoarseness or a problem with your voice 0=No problem  Clearing your throat 4=Severe problem  Excess throat mucous or postnasal drip 5=Problem as bad as it can be Difficulty swallowing food, liquids, or pills 2=Moderate or slight problem  Coughing after you eat or after lying down 2=Moderate or slight problem  Breathing difficulties or choking episodes 3+Moderate problem  Troublesome or annoying cough 0=No problem  Sensation of something sticking in your throat or a lump in your  throat 4=Severe problem  Heartburn, chest pain, indigestion, or stomach acid coming up 3+Moderate problem   Total: 23    Normative data suggests that an RSI of greater than or equal to 13 is clinically significant  Therefore, an RSI > 13 may be indicative of significant reflux disease.  TODAY'S TREATMENT:  Skilled education provided on general aspiration and reflux precautions including small frequent meals, avoiding acidic or spicy food, remaining upright for 30 to 45 minutes after consuming PO's, potentially placing a brick under the head of their bed.    PATIENT EDUCATION: Education details: Adequate oropharyngeal abilities, oropharyngeal vs esophageal phase dysphagia Person educated: Patient and Spouse Education method: Explanation Education comprehension: verbalized understanding     ASSESSMENT:  CLINICAL IMPRESSION: Patient is a 78 y.o. male who was seen today for a dysphagia evaluation d/t choking sensation. Pt describes a sensation of food and sometimes liquid getting stuck right here (pointing to lower neck region). He and his significant other report that it occurs ~ 3 times per week, drinking water helps it go down, reports that he has to be careful with spicy foods, tomato sauce or acidic fruits, and sometimes it wakes me up in the middle of night and coughing when he goes to bed.  He is not currently taking a PPI. Of note, this symptoms are similar to those he reported during his GI consult (10/08/2019).   Prior to PO consumption, pt was observed clearing his throat multiple times. Both report that pt clears his throat throughout the day even when not eating or drinking.   During today's evaluation, he presented with adequate oropharyngeal abilities when consuming thin  liquids, puree, mixed consistency (fruit cocktail cup), and graham crackers. Pt didn't clear his throat or cough when consuming the above but throat clearing was increased after consumption to an almost  continues point for several minutes. Globus sensation was not reported during this evaluation.   Pt's score of 200 on the Mann Assessment of Swallowing Ability (MASA) also reflects no abnormality was detected.   At this time, pt doesn't present with an oropharyngeal dysphagia but given that his symptoms are similar to those reported when he was experiencing GERD, would recommend follow up with GI.   PLAN: Recommend follow up with GI.   Chaniyah Jahr B. Rubbie, M.S., CCC-SLP, Tree surgeon Certified Brain Injury Specialist [facility]  [facility] Office [phone] Ascom [phone] Fax [phone]

## 2023-11-22 ENCOUNTER — Encounter: Admitting: Speech Pathology

## 2023-11-27 ENCOUNTER — Encounter: Admitting: Speech Pathology

## 2023-11-29 ENCOUNTER — Ambulatory Visit: Admitting: Speech Pathology

## 2023-12-04 ENCOUNTER — Encounter: Admitting: Speech Pathology

## 2023-12-06 ENCOUNTER — Encounter: Admitting: Speech Pathology

## 2023-12-11 ENCOUNTER — Encounter: Admitting: Speech Pathology

## 2023-12-13 ENCOUNTER — Encounter: Admitting: Speech Pathology

## 2023-12-18 ENCOUNTER — Encounter: Admitting: Speech Pathology

## 2023-12-20 ENCOUNTER — Encounter: Admitting: Speech Pathology

## 2023-12-25 ENCOUNTER — Encounter: Admitting: Speech Pathology

## 2023-12-27 ENCOUNTER — Encounter: Admitting: Speech Pathology

## 2023-12-27 ENCOUNTER — Encounter: Payer: Self-pay | Admitting: Urology

## 2024-01-01 ENCOUNTER — Encounter: Admitting: Speech Pathology

## 2024-01-03 ENCOUNTER — Encounter: Admitting: Speech Pathology

## 2024-09-02 ENCOUNTER — Ambulatory Visit: Admitting: Urology

## 2024-09-03 ENCOUNTER — Ambulatory Visit: Admitting: Urology
